# Patient Record
Sex: Female | Born: 1960 | Race: White | Hispanic: No | Marital: Married | Smoking: Former smoker
Health system: Southern US, Community
[De-identification: ages and names within clinical notes are randomized; demographics above are authoritative.]

## PROBLEM LIST (undated history)

## (undated) DIAGNOSIS — K589 Irritable bowel syndrome without diarrhea: Secondary | ICD-10-CM

## (undated) DIAGNOSIS — B0229 Other postherpetic nervous system involvement: Secondary | ICD-10-CM

## (undated) HISTORY — PX: EYE MUSCLE SURGERY: SHX370

## (undated) HISTORY — PX: TONSILLECTOMY: SUR1361

## (undated) HISTORY — DX: Other postherpetic nervous system involvement: B02.29

## (undated) HISTORY — DX: Irritable bowel syndrome, unspecified: K58.9

---

## 2001-05-26 ENCOUNTER — Other Ambulatory Visit: Admission: RE | Admit: 2001-05-26 | Discharge: 2001-05-26 | Payer: Self-pay | Admitting: Gynecology

## 2001-06-02 ENCOUNTER — Encounter (INDEPENDENT_AMBULATORY_CARE_PROVIDER_SITE_OTHER): Payer: Self-pay | Admitting: Specialist

## 2001-06-02 ENCOUNTER — Other Ambulatory Visit: Admission: RE | Admit: 2001-06-02 | Discharge: 2001-06-02 | Payer: Self-pay | Admitting: Gynecology

## 2002-07-30 ENCOUNTER — Encounter: Payer: Self-pay | Admitting: General Practice

## 2002-07-30 ENCOUNTER — Encounter: Admission: RE | Admit: 2002-07-30 | Discharge: 2002-07-30 | Payer: Self-pay | Admitting: General Practice

## 2003-06-14 ENCOUNTER — Encounter: Payer: Self-pay | Admitting: Family Medicine

## 2003-06-14 ENCOUNTER — Encounter: Admission: RE | Admit: 2003-06-14 | Discharge: 2003-06-14 | Payer: Self-pay | Admitting: Family Medicine

## 2003-08-25 ENCOUNTER — Encounter: Admission: RE | Admit: 2003-08-25 | Discharge: 2003-08-25 | Payer: Self-pay | Admitting: Orthopedic Surgery

## 2003-08-25 ENCOUNTER — Encounter: Payer: Self-pay | Admitting: Orthopedic Surgery

## 2003-09-23 ENCOUNTER — Encounter: Admission: RE | Admit: 2003-09-23 | Discharge: 2003-09-23 | Payer: Self-pay | Admitting: Orthopedic Surgery

## 2003-10-13 ENCOUNTER — Encounter: Admission: RE | Admit: 2003-10-13 | Discharge: 2003-10-13 | Payer: Self-pay | Admitting: Orthopedic Surgery

## 2004-11-22 ENCOUNTER — Encounter: Admission: RE | Admit: 2004-11-22 | Discharge: 2004-11-22 | Payer: Self-pay | Admitting: Family Medicine

## 2005-02-15 ENCOUNTER — Encounter: Admission: RE | Admit: 2005-02-15 | Discharge: 2005-02-15 | Payer: Self-pay | Admitting: Obstetrics and Gynecology

## 2006-02-20 ENCOUNTER — Other Ambulatory Visit: Admission: RE | Admit: 2006-02-20 | Discharge: 2006-02-20 | Payer: Self-pay | Admitting: Obstetrics & Gynecology

## 2006-05-16 ENCOUNTER — Encounter: Admission: RE | Admit: 2006-05-16 | Discharge: 2006-05-16 | Payer: Self-pay | Admitting: Family Medicine

## 2006-05-21 ENCOUNTER — Encounter: Admission: RE | Admit: 2006-05-21 | Discharge: 2006-05-21 | Payer: Self-pay | Admitting: Otolaryngology

## 2006-05-23 ENCOUNTER — Encounter: Admission: RE | Admit: 2006-05-23 | Discharge: 2006-05-23 | Payer: Self-pay | Admitting: Otolaryngology

## 2006-08-22 ENCOUNTER — Encounter: Admission: RE | Admit: 2006-08-22 | Discharge: 2006-08-22 | Payer: Self-pay | Admitting: Obstetrics & Gynecology

## 2007-09-15 ENCOUNTER — Encounter: Admission: RE | Admit: 2007-09-15 | Discharge: 2007-09-15 | Payer: Self-pay | Admitting: Obstetrics and Gynecology

## 2007-09-18 ENCOUNTER — Encounter: Admission: RE | Admit: 2007-09-18 | Discharge: 2007-09-18 | Payer: Self-pay | Admitting: Obstetrics and Gynecology

## 2008-02-22 ENCOUNTER — Encounter: Admission: RE | Admit: 2008-02-22 | Discharge: 2008-02-22 | Payer: Self-pay | Admitting: General Practice

## 2008-04-08 ENCOUNTER — Encounter (INDEPENDENT_AMBULATORY_CARE_PROVIDER_SITE_OTHER): Payer: Self-pay | Admitting: General Surgery

## 2008-04-08 ENCOUNTER — Ambulatory Visit (HOSPITAL_BASED_OUTPATIENT_CLINIC_OR_DEPARTMENT_OTHER): Admission: RE | Admit: 2008-04-08 | Discharge: 2008-04-08 | Payer: Self-pay | Admitting: General Surgery

## 2008-08-09 ENCOUNTER — Other Ambulatory Visit: Admission: RE | Admit: 2008-08-09 | Discharge: 2008-08-09 | Payer: Self-pay | Admitting: Obstetrics and Gynecology

## 2010-12-16 ENCOUNTER — Encounter: Payer: Self-pay | Admitting: Obstetrics and Gynecology

## 2011-04-09 NOTE — Op Note (Signed)
NAMELACREASHA, HINDS                ACCOUNT NO.:  [id number]   MEDICAL RECORD NO.:  [id number]          PATIENT TYPE:  AMB   LOCATION:  DSC                          FACILITY:  [facility]   PHYSICIAN:  Gabrielle Dare. Janee Morn, M.D.DATE OF BIRTH:  1961-05-15   DATE OF PROCEDURE:  04/08/2008  DATE OF DISCHARGE:                               OPERATIVE REPORT   PREOPERATIVE DIAGNOSIS:  Mass, right posterior neck.   POSTOPERATIVE DIAGNOSIS:  Mass, right posterior neck.   PROCEDURE:  Excision of mass, right posterior neck.   SURGEON:  Gabrielle Dare. Janee Morn, MD.   ANESTHESIA:  MAC   HISTORY OF PRESENT ILLNESS:  Ms. Crystal [name] is a 50 year old female who I  evaluated in the office for symptomatic tender mass in her right  posterior neck.  CT scan of the neck had demonstrated no suspicious  abnormality.  She has a palpable 2-cm mass, which has been tender.  She  presents for elective excision.   PROCEDURE IN DETAIL:  Informed consent was obtained.  The patient was  identified in the preop holding area.  Her site was marked.  She  received intravenous antibiotics.  She was brought to the operating  room.  MAC anesthesia was administered by the Anesthesia staff.  She was  placed in a prone position.  Posterior neck on the right was prepped and  draped in sterile fashion.  This lesion was palpable on the subcutaneous  tissue about 2-cm in size.  The area over the top of the palpable lesion  was anesthetized with 0.25% Marcaine with epinephrine mixed with 1%  lidocaine plain.  A transverse incision was made.  Subcutaneous tissues  were dissected down and this mass was circumferentially dissected.  It  extended down and was adherent to the fascia.  It was completely excised  and sent to Pathology.  Wound was copiously irrigated.  Some additional  local anesthetic was injected.  Meticulous hemostasis was ensured.  Sponge, needle, and instrument counts were correct.  The wound was then  closed in layers with deep  tissues approximated with interrupted 3-0  Vicryl sutures and the skin closed with a running 4-0 Monocryl  subcuticular stitch.  Dermabond was then placed as final closure.  Sponge, needle, and instrument counts were again correct.  The patient  tolerated the procedure well without apparent complication.  She was  taken to the recovery room in stable condition.      Gabrielle Dare Janee Morn, M.D.  Electronically Signed     BET/MEDQ  D:  04/08/2008  T:  04/08/2008  Job:  [id number]   cc:   Louanna Raw

## 2011-08-21 LAB — POCT HEMOGLOBIN-HEMACUE: Hemoglobin: 13.8

## 2012-03-20 LAB — HM COLONOSCOPY

## 2013-09-27 ENCOUNTER — Ambulatory Visit (INDEPENDENT_AMBULATORY_CARE_PROVIDER_SITE_OTHER): Payer: Self-pay | Admitting: General Practice

## 2013-09-27 DIAGNOSIS — W19XXXA Unspecified fall, initial encounter: Secondary | ICD-10-CM

## 2013-09-27 DIAGNOSIS — S0990XA Unspecified injury of head, initial encounter: Secondary | ICD-10-CM

## 2013-09-27 NOTE — Progress Notes (Signed)
  Subjective:    Patient ID: Crystal [name], female    DOB: [birth year], 52 y.o.   MRN: [id number]  HPI Patient presents to office with complaints of forehead laceration, due to fall. Reports fall occurred 20 minutes ago, while standing on bed attempting to clean headboard. Crystal [name] report loosing her balance and then hit head on ceiling fan blade or glass globe cover. Reports applying pressure to bleeding area immediately. She denies loss of consciousness.     Review of Systems  Constitutional: Negative for fever and chills.  Eyes: Negative for pain, redness and visual disturbance.  Respiratory: Negative for chest tightness and shortness of breath.   Cardiovascular: Negative for chest pain and palpitations.  Neurological: Negative for dizziness, syncope, speech difficulty, weakness and headaches.       Objective:   Physical Exam  Constitutional: She is oriented to person, place, and time. She appears well-developed and well-nourished.  Cardiovascular: Normal rate, regular rhythm and normal heart sounds.   Pulmonary/Chest: Effort normal and breath sounds normal. No respiratory distress. She exhibits no tenderness.  Musculoskeletal: She exhibits edema and tenderness.  Tenderness noted to left forehead, surrounding laceration. 1 inch laceration, small amount bloody drainage.   Neurological: She is alert and oriented to person, place, and time. No cranial nerve deficit. Coordination normal.  Skin: Skin is warm and dry.  Psychiatric: She has a normal mood and affect.          Assessment & Plan:  1. Head injury, initial encounter -cleansed left forehead area with normal saline and dried with sterile gauze -dermabond (skin glue) applied by MMM -discussed proper care of head injury and signs/symptoms that require medical attention with patient   2. Fall, initial encounter - CT Head Wo Contrast; Future -discussed importance of having CT scan, patient verbalized understanding and has  responsible driver to transport her to the hospital -RTO on 09/30/13 for follow up of forehead laceration -Patient verbalized understanding  Coralie Keens, FNP-C

## 2013-09-27 NOTE — Patient Instructions (Signed)
Head Injury, Adult °You have had a head injury that does not appear serious at this time. A concussion is a state of changed mental ability, usually from a blow to the head. You should take clear liquids for the rest of the day and then resume your regular diet. You should not take sedatives or alcoholic beverages for as long as directed by your caregiver after discharge. After injuries such as yours, most problems occur within the first 24 hours. °SYMPTOMS °These minor symptoms may be experienced after discharge: °· Memory difficulties. °· Dizziness. °· Headaches. °· Double vision. °· Hearing difficulties. °· Depression. °· Tiredness. °· Weakness. °· Difficulty with concentration. °If you experience any of these problems, you should not be alarmed. A concussion requires a few days for recovery. Many patients with head injuries frequently experience such symptoms. Usually, these problems disappear without medical care. If symptoms last for more than one day, notify your caregiver. See your caregiver sooner if symptoms are becoming worse rather than better. °HOME CARE INSTRUCTIONS  °· During the next 24 hours you must stay with someone who can watch you for the warning signs listed below. °Although it is unlikely that serious side effects will occur, you should be aware of signs and symptoms which may necessitate your return to this location. Side effects may occur up to 7  10 days following the injury. It is important for you to carefully monitor your condition and contact your caregiver or seek immediate medical attention if there is a change in your condition. °SEEK IMMEDIATE MEDICAL CARE IF:  °· There is confusion or drowsiness. °· You can not awaken the injured person. °· There is nausea (feeling sick to your stomach) or continued, forceful vomiting. °· You notice dizziness or unsteadiness which is getting worse, or inability to walk. °· You have convulsions or unconsciousness. °· You experience severe,  persistent headaches not relieved by over-the-counter or prescription medicines for pain. (Do not take aspirin as this impairs clotting abilities). Take other pain medications only as directed. °· You can not use arms or legs normally. °· There is clear or bloody discharge from the nose or ears. °MAKE SURE YOU:  °· Understand these instructions. °· Will watch your condition. °· Will get help right away if you are not doing well or get worse. °Document Released: 11/11/2005 Document Revised: 02/03/2012 Document Reviewed: 09/29/2009 °ExitCare® Patient Information ©2014 [employer]. ° °

## 2014-10-31 ENCOUNTER — Telehealth: Payer: Self-pay | Admitting: General Practice

## 2014-11-01 ENCOUNTER — Ambulatory Visit (INDEPENDENT_AMBULATORY_CARE_PROVIDER_SITE_OTHER): Payer: PRIVATE HEALTH INSURANCE | Admitting: Family Medicine

## 2014-11-01 ENCOUNTER — Encounter (INDEPENDENT_AMBULATORY_CARE_PROVIDER_SITE_OTHER): Payer: Self-pay

## 2014-11-01 ENCOUNTER — Encounter: Payer: Self-pay | Admitting: Family Medicine

## 2014-11-01 VITALS — BP 109/62 | HR 64 | Temp 97.8°F | Ht 64.0 in | Wt 133.2 lb

## 2014-11-01 DIAGNOSIS — L0501 Pilonidal cyst with abscess: Secondary | ICD-10-CM

## 2014-11-01 MED ORDER — PENICILLIN V POTASSIUM 500 MG PO TABS: 500.0000 mg | ORAL_TABLET | Freq: Four times a day (QID) | ORAL | Status: DC

## 2014-11-01 MED ORDER — HYDROCODONE-ACETAMINOPHEN 5-325 MG PO TABS: 1.0000 | ORAL_TABLET | Freq: Four times a day (QID) | ORAL | Status: DC | PRN

## 2014-11-01 MED ORDER — CEFTRIAXONE SODIUM 1 G IJ SOLR
1.0000 g | Freq: Once | INTRAMUSCULAR | Status: AC
  Administered 2014-11-01: 1 g via INTRAMUSCULAR

## 2014-11-01 NOTE — Progress Notes (Signed)
   Subjective:    Patient ID: Crystal [name], female    DOB: 1961-06-03, 53 y.o.   MRN: [id number]  HPI Patient c/o cyst in her coccyx area.  Review of Systems  Constitutional: Negative for fever.  HENT: Negative for ear pain.   Eyes: Negative for discharge.  Respiratory: Negative for cough.   Cardiovascular: Negative for chest pain.  Gastrointestinal: Negative for abdominal distention.  Endocrine: Negative for polyuria.  Genitourinary: Negative for difficulty urinating.  Musculoskeletal: Negative for gait problem and neck pain.  Skin: Negative for color change and rash.  Neurological: Negative for speech difficulty and headaches.  Psychiatric/Behavioral: Negative for agitation.       Objective:    BP 109/62 mmHg  Pulse 64  Temp(Src) 97.8 F (36.6 C) (Oral)  Ht 5\' 4"  (1.626 m)  Wt 133 lb 3.2 oz (60.419 kg)  BMI 22.85 kg/m2 Physical Exam  Constitutional: She is oriented to person, place, and time. She appears well-developed and well-nourished.  HENT:  Head: Normocephalic and atraumatic.  Mouth/Throat: Oropharynx is clear and moist.  Eyes: Pupils are equal, round, and reactive to light.  Neck: Normal range of motion. Neck supple.  Cardiovascular: Normal rate and regular rhythm.   No murmur heard. Pulmonary/Chest: Effort normal and breath sounds normal.  Abdominal: Soft. Bowel sounds are normal. There is no tenderness.  Neurological: She is alert and oriented to person, place, and time.  Skin: Skin is warm and dry.  Coccyx region with tenderness and fluctuance. x Psychiatric: She has a normal mood and affect.     Procedure - Cyst area cleansed and prepped with betadine and then area anesthetized with 3 cc's lidocaine with epi and then once adequate anesthesia is acquired a stab incision is made in the gluteal cleft proximal to coccyx and cyst mass area and approx 2 cc's of purulence and serous sanguin drained and then dressing applied.     Assessment & Plan:    ICD-9-CM ICD-10-CM   1. Pilonidal abscess 685.0 L05.01 cefTRIAXone (ROCEPHIN) injection 1 g     HYDROcodone-acetaminophen (NORCO) 5-325 MG per tablet     penicillin v potassium (VEETID) 500 MG tablet     Ambulatory referral to General Surgery   Referral to Surgery made, 1 gram of rocephin IM given, Pen VK 500mg  one po qid x 10 days  No Follow-up on file.  Deatra CanterWilliam J Oxford FNP

## 2014-11-01 NOTE — Telephone Encounter (Signed)
Appointment given for 6:30 this evening to address the cyst and then patient is to schedule to evaluate her shoulder.

## 2014-11-03 ENCOUNTER — Ambulatory Visit (INDEPENDENT_AMBULATORY_CARE_PROVIDER_SITE_OTHER): Payer: Self-pay | Admitting: Surgery

## 2014-11-03 NOTE — H&P (Signed)
Crystal [name] 11/03/2014 4:50 PM Location: [facility] Patient #: [id number] DOB: March 17, 1961 Married / Language: Undefined / Race: Undefined Female History of Present Illness (Ly Wass A. Werner Labella MD; 11/03/2014 4:52 PM) Patient words: Patient presents with a pilonidal cyst. This [facility]. She still has pain and has chronic pain over her tailbone. As a teenager, she had a pilonidal cyst excised. This is the same spot. He gets red and tender. This was incised 2 days ago and she was placed on penicillin. She is here today to discuss definitive treatment. She still some discomfort over that region. No drainage or fever.  The patient is a 53 year old female    Physical Exam (Nero Sawatzky A. Revel Stellmach MD; 11/03/2014 4:54 PM)  General Mental Status-Alert. General Appearance-Consistent with stated age. Hydration-Well hydrated. Voice-Normal.  Head and Neck Head-normocephalic, atraumatic with no lesions or palpable masses. Trachea-midline. Thyroid Gland Characteristics - normal size and consistency.  Eye Eyeball - Bilateral-Extraocular movements intact. Sclera/Conjunctiva - Bilateral-No scleral icterus.  Chest and Lung Exam Chest and lung exam reveals -quiet, even and easy respiratory effort with no use of accessory muscles and on auscultation, normal breath sounds, no adventitious sounds and normal vocal resonance. Inspection Chest Wall - Normal. Back - normal.  Breast Breast - Left-Symmetric, Non Tender, No Biopsy scars, no Dimpling, No Inflammation, No Lumpectomy scars, No Mastectomy scars, No Peau d' Orange. Breast - Right-Symmetric, Non Tender, No Biopsy scars, no Dimpling, No Inflammation, No Lumpectomy scars, No Mastectomy scars, No Peau d' Orange. Breast Lump-No Palpable Breast Mass.  Cardiovascular Cardiovascular examination reveals -normal heart sounds, regular rate and rhythm with no murmurs and  normal pedal pulses bilaterally.  Abdomen Inspection Inspection of the abdomen reveals - No Hernias. Skin - Scar - no surgical scars. Palpation/Percussion Palpation and Percussion of the abdomen reveal - Soft, Non Tender, No Rebound tenderness, No Rigidity (guarding) and No hepatosplenomegaly. Auscultation Auscultation of the abdomen reveals - Bowel sounds normal.  Rectal Note: 1 cm area of minimal inflammation over coccyx consistent with small pilonidal cyst. no draianable abscess. small incision noted.    Neurologic Neurologic evaluation reveals -alert and oriented x 3 with no impairment of recent or remote memory. Mental Status-Normal.  Musculoskeletal Normal Exam - Left-Upper Extremity Strength Normal and Lower Extremity Strength Normal. Normal Exam - Right-Upper Extremity Strength Normal and Lower Extremity Strength Normal.  Lymphatic Head & Neck  General Head & Neck Lymphatics: Bilateral - Description - Normal. Axillary  General Axillary Region: Bilateral - Description - Normal. Tenderness - Non Tender. Femoral & Inguinal  Generalized Femoral & Inguinal Lymphatics: Bilateral - Description - Normal. Tenderness - Non Tender.    Assessment & Plan (Isa Kohlenberg A. Fernando Stoiber MD; 11/03/2014 4:55 PM)  INFECTED PILONIDAL CYST (685.1  L05.91) Impression: it was drained 2 days ago. minimal erythema and no fluctuance. Will need excision once infection cleared. Stop penicillin and start doxycycline 100 mg po bid for two weeks. Risk of bleeding infection poor healing and the need for other operations. Non operative care is antibiotics and observation. She agrees to procedeed.

## 2014-12-09 ENCOUNTER — Ambulatory Visit (INDEPENDENT_AMBULATORY_CARE_PROVIDER_SITE_OTHER): Payer: PRIVATE HEALTH INSURANCE | Admitting: Family Medicine

## 2014-12-09 ENCOUNTER — Ambulatory Visit (INDEPENDENT_AMBULATORY_CARE_PROVIDER_SITE_OTHER): Payer: PRIVATE HEALTH INSURANCE

## 2014-12-09 VITALS — BP 124/77 | HR 59 | Temp 97.1°F | Ht 64.0 in | Wt 132.4 lb

## 2014-12-09 DIAGNOSIS — J069 Acute upper respiratory infection, unspecified: Secondary | ICD-10-CM

## 2014-12-09 DIAGNOSIS — M542 Cervicalgia: Secondary | ICD-10-CM

## 2014-12-09 DIAGNOSIS — B0229 Other postherpetic nervous system involvement: Secondary | ICD-10-CM

## 2014-12-09 MED ORDER — AZITHROMYCIN 250 MG PO TABS: ORAL_TABLET | ORAL | Status: DC

## 2014-12-09 MED ORDER — GABAPENTIN 300 MG PO CAPS: 300.0000 mg | ORAL_CAPSULE | Freq: Three times a day (TID) | ORAL | Status: DC

## 2014-12-09 MED ORDER — METHYLPREDNISOLONE (PAK) 4 MG PO TABS: ORAL_TABLET | ORAL | Status: DC

## 2014-12-09 NOTE — Progress Notes (Signed)
   Subjective:    Patient ID: Crystal [name], female    DOB: 05-14-1961, 54 y.o.   MRN: [id number]  HPI Patient is here c/o left neck discomfort and numbness.  She has had outbreak of shingles a month ago.  She has been having uri sx's and right ear pain.  Review of Systems  Constitutional: Negative for fever.  HENT: Negative for ear pain.   Eyes: Negative for discharge.  Respiratory: Negative for cough.   Cardiovascular: Negative for chest pain.  Gastrointestinal: Negative for abdominal distention.  Endocrine: Negative for polyuria.  Genitourinary: Negative for difficulty urinating.  Musculoskeletal: Negative for gait problem and neck pain.  Skin: Negative for color change and rash.  Neurological: Negative for speech difficulty and headaches.  Psychiatric/Behavioral: Negative for agitation.       Objective:    BP 124/77 mmHg  Pulse 59  Temp(Src) 97.1 F (36.2 C) (Oral)  Ht 5\' 4"  (1.626 m)  Wt 132 lb 6.4 oz (60.056 kg)  BMI 22.72 kg/m2 Physical Exam  Constitutional: She is oriented to person, place, and time. She appears well-developed and well-nourished.  HENT:  Head: Normocephalic and atraumatic.  Mouth/Throat: Oropharynx is clear and moist.  Eyes: Pupils are equal, round, and reactive to light.  Neck: Normal range of motion. Neck supple.  Cardiovascular: Normal rate and regular rhythm.   No murmur heard. Pulmonary/Chest: Effort normal and breath sounds normal.  Abdominal: Soft. Bowel sounds are normal. There is no tenderness.  Neurological: She is alert and oriented to person, place, and time.  Skin: Skin is warm and dry.  Psychiatric: She has a normal mood and affect.          Assessment & Plan:     ICD-9-CM ICD-10-CM   1. URI, acute 465.9 J06.9 methylPREDNIsolone (MEDROL DOSPACK) 4 MG tablet     azithromycin (ZITHROMAX) 250 MG tablet  2. Post herpetic neuralgia 053.19 B02.29 gabapentin (NEURONTIN) 300 MG capsule     No Follow-up on file.  Deatra CanterWilliam J  Oxford FNP

## 2014-12-23 ENCOUNTER — Telehealth: Payer: Self-pay | Admitting: Family Medicine

## 2014-12-23 ENCOUNTER — Other Ambulatory Visit: Payer: Self-pay | Admitting: Family Medicine

## 2014-12-23 MED ORDER — GABAPENTIN 600 MG PO TABS: 600.0000 mg | ORAL_TABLET | Freq: Three times a day (TID) | ORAL | Status: DC

## 2014-12-23 NOTE — Telephone Encounter (Signed)
Pt aware.

## 2014-12-23 NOTE — Telephone Encounter (Signed)
Can increase gabapentin to two po tid and rx for 600mg  po tid sent to pharmacy

## 2015-01-18 ENCOUNTER — Encounter (HOSPITAL_BASED_OUTPATIENT_CLINIC_OR_DEPARTMENT_OTHER): Payer: Self-pay

## 2015-01-18 ENCOUNTER — Ambulatory Visit (HOSPITAL_BASED_OUTPATIENT_CLINIC_OR_DEPARTMENT_OTHER): Admit: 2015-01-18 | Payer: Self-pay | Admitting: Surgery

## 2015-01-18 SURGERY — EXCISION, PILONIDAL CYST, EXTENSIVE
Anesthesia: General

## 2015-01-27 ENCOUNTER — Ambulatory Visit (INDEPENDENT_AMBULATORY_CARE_PROVIDER_SITE_OTHER): Payer: PRIVATE HEALTH INSURANCE | Admitting: Family

## 2015-01-27 ENCOUNTER — Encounter: Payer: Self-pay | Admitting: Family

## 2015-01-27 VITALS — BP 130/77 | HR 64 | Temp 97.6°F | Ht 64.0 in | Wt 130.0 lb

## 2015-01-27 DIAGNOSIS — R059 Cough, unspecified: Secondary | ICD-10-CM

## 2015-01-27 DIAGNOSIS — R05 Cough: Secondary | ICD-10-CM | POA: Diagnosis not present

## 2015-01-27 DIAGNOSIS — J0110 Acute frontal sinusitis, unspecified: Secondary | ICD-10-CM

## 2015-01-27 MED ORDER — AMOXICILLIN-POT CLAVULANATE 875-125 MG PO TABS: 1.0000 | ORAL_TABLET | Freq: Two times a day (BID) | ORAL | Status: DC

## 2015-01-27 MED ORDER — BENZONATATE 200 MG PO CAPS: 200.0000 mg | ORAL_CAPSULE | Freq: Three times a day (TID) | ORAL | Status: DC | PRN

## 2015-01-27 NOTE — Patient Instructions (Signed)
Sinusitis Sinusitis is redness, soreness, and inflammation of the paranasal sinuses. Paranasal sinuses are air pockets within the bones of your face (beneath the eyes, the middle of the forehead, or above the eyes). In healthy paranasal sinuses, mucus is able to drain out, and air is able to circulate through them by way of your nose. However, when your paranasal sinuses are inflamed, mucus and air can become trapped. This can allow bacteria and other germs to grow and cause infection. Sinusitis can develop quickly and last only a short time (acute) or continue over a long period (chronic). Sinusitis that lasts for more than 12 weeks is considered chronic.  CAUSES  Causes of sinusitis include:  Allergies.  Structural abnormalities, such as displacement of the cartilage that separates your nostrils (deviated septum), which can decrease the air flow through your nose and sinuses and affect sinus drainage.  Functional abnormalities, such as when the small hairs (cilia) that line your sinuses and help remove mucus do not work properly or are not present. SIGNS AND SYMPTOMS  Symptoms of acute and chronic sinusitis are the same. The primary symptoms are pain and pressure around the affected sinuses. Other symptoms include:  Upper toothache.  Earache.  Headache.  Bad breath.  Decreased sense of smell and taste.  A cough, which worsens when you are lying flat.  Fatigue.  Fever.  Thick drainage from your nose, which often is green and may contain pus (purulent).  Swelling and warmth over the affected sinuses. DIAGNOSIS  Your health care provider will perform a physical exam. During the exam, your health care provider may:  Look in your nose for signs of abnormal growths in your nostrils (nasal polyps).  Tap over the affected sinus to check for signs of infection.  View the inside of your sinuses (endoscopy) using an imaging device that has a light attached (endoscope). If your health  care provider suspects that you have chronic sinusitis, one or more of the following tests may be recommended:  Allergy tests.  Nasal culture. A sample of mucus is taken from your nose, sent to a lab, and screened for bacteria.  Nasal cytology. A sample of mucus is taken from your nose and examined by your health care provider to determine if your sinusitis is related to an allergy. TREATMENT  Most cases of acute sinusitis are related to a viral infection and will resolve on their own within 10 days. Sometimes medicines are prescribed to help relieve symptoms (pain medicine, decongestants, nasal steroid sprays, or saline sprays).  However, for sinusitis related to a bacterial infection, your health care provider will prescribe antibiotic medicines. These are medicines that will help kill the bacteria causing the infection.  Rarely, sinusitis is caused by a fungal infection. In theses cases, your health care provider will prescribe antifungal medicine. For some cases of chronic sinusitis, surgery is needed. Generally, these are cases in which sinusitis recurs more than 3 times per year, despite other treatments. HOME CARE INSTRUCTIONS   Drink plenty of water. Water helps thin the mucus so your sinuses can drain more easily.  Use a humidifier.  Inhale steam 3 to 4 times a day (for example, sit in the bathroom with the shower running).  Apply a warm, moist washcloth to your face 3 to 4 times a day, or as directed by your health care provider.  Use saline nasal sprays to help moisten and clean your sinuses.  Take medicines only as directed by your health care provider.    If you were prescribed either an antibiotic or antifungal medicine, finish it all even if you start to feel better. SEEK IMMEDIATE MEDICAL CARE IF:  You have increasing pain or severe headaches.  You have nausea, vomiting, or drowsiness.  You have swelling around your face.  You have vision problems.  You have a stiff  neck.  You have difficulty breathing. MAKE SURE YOU:   Understand these instructions.  Will watch your condition.  Will get help right away if you are not doing well or get worse. Document Released: 11/11/2005 Document Revised: 03/28/2014 Document Reviewed: 11/26/2011 ExitCare Patient Information 2015 [employer]. This information is not intended to replace advice given to you by your health care provider. Make sure you discuss any questions you have with your health care provider.  - Take meds as prescribed - Use a cool mist humidifier  -Use saline nose sprays frequently -Saline irrigations of the nose can be very helpful if done frequently.  * 4X daily for 1 week*  * Use of a nettie pot can be helpful with this. Follow directions with this* -Force fluids -For any cough or congestion  Use plain Mucinex- regular strength or max strength is fine   * Children- consult with Pharmacist for dosing -For fever or aces or pains- take tylenol or ibuprofen appropriate for age and weight.  * for fevers greater than 101 orally you may alternate ibuprofen and tylenol every  3 hours. -Throat lozenges if help   [name], FNP  

## 2015-01-27 NOTE — Progress Notes (Signed)
Subjective:    Patient ID: Crystal [name], female    DOB: [birth year], 54 y.o.   MRN: [id number]  Sinusitis The current episode started in the past 7 days. The problem has been waxing and waning since onset. There has been no fever. The fever has been present for 1 to 2 days. Her pain is at a severity of 5/10. The pain is mild. Associated symptoms include chills, congestion, coughing, ear pain, headaches, a hoarse voice and sinus pressure. Pertinent negatives include no shortness of breath, sneezing or sore throat. Past treatments include acetaminophen. The treatment provided mild relief.  Otalgia  Associated symptoms include coughing and headaches. Pertinent negatives include no sore throat.      Review of Systems  Constitutional: Positive for chills.  HENT: Positive for congestion, ear pain, hoarse voice and sinus pressure. Negative for sneezing and sore throat.   Eyes: Negative.   Respiratory: Positive for cough. Negative for shortness of breath.   Cardiovascular: Negative.  Negative for palpitations.  Gastrointestinal: Negative.   Endocrine: Negative.   Genitourinary: Negative.   Musculoskeletal: Negative.   Neurological: Positive for headaches.  Hematological: Negative.   Psychiatric/Behavioral: Negative.   All other systems reviewed and are negative.      Objective:   Physical Exam  Constitutional: She is oriented to person, place, and time. She appears well-developed and well-nourished. No distress.  HENT:  Head: Normocephalic and atraumatic.  Right Ear: External ear normal.  Left Ear: External ear normal.  Nose: Right sinus exhibits maxillary sinus tenderness and frontal sinus tenderness. Left sinus exhibits maxillary sinus tenderness and frontal sinus tenderness.  Mouth/Throat: Oropharynx is clear and moist.  Nasal passage erythemas with mild swelling  Oropharynx erythemas   Eyes: Pupils are equal, round, and reactive to light.  Neck: Normal range of motion. Neck  supple. No thyromegaly present.  Cardiovascular: Normal rate, regular rhythm, normal heart sounds and intact distal pulses.   No murmur heard. Pulmonary/Chest: Effort normal and breath sounds normal. No respiratory distress. She has no wheezes.  Abdominal: Soft. Bowel sounds are normal. She exhibits no distension. There is no tenderness.  Musculoskeletal: Normal range of motion. She exhibits no edema or tenderness.  Neurological: She is alert and oriented to person, place, and time. She has normal reflexes. No cranial nerve deficit.  Skin: Skin is warm and dry.  Psychiatric: She has a normal mood and affect. Her behavior is normal. Judgment and thought content normal.  Vitals reviewed.     BP 130/77 mmHg  Pulse 64  Temp(Src) 97.6 F (36.4 C) (Oral)  Ht 5\' 4"  (1.626 m)  Wt 130 lb (58.968 kg)  BMI 22.30 kg/m2     Assessment & Plan:  1. Acute frontal sinusitis, recurrence not specified - Take meds as prescribed - Use a cool mist humidifier  -Use saline nose sprays frequently -Saline irrigations of the nose can be very helpful if done frequently.  * 4X daily for 1 week*  * Use of a nettie pot can be helpful with this. Follow directions with this* -Force fluids -For any cough or congestion  Use plain Mucinex- regular strength or max strength is fine   * Children- consult with Pharmacist for dosing -For fever or aces or pains- take tylenol or ibuprofen appropriate for age and weight.  * for fevers greater than 101 orally you may alternate ibuprofen and tylenol every  3 hours. -Throat lozenges if help - amoxicillin-clavulanate (AUGMENTIN) 875-125 MG per tablet; Take 1 tablet by  mouth 2 (two) times daily.  Dispense: 14 tablet; Refill: 0  2. Cough - amoxicillin-clavulanate (AUGMENTIN) 875-125 MG per tablet; Take 1 tablet by mouth 2 (two) times daily.  Dispense: 14 tablet; Refill: 0 - benzonatate (TESSALON) 200 MG capsule; Take 1 capsule (200 mg total) by mouth 3 (three) times daily  as needed.  Dispense: 30 capsule; Refill: 1  Jannifer Rodney, FNP

## 2015-02-03 ENCOUNTER — Telehealth: Payer: Self-pay | Admitting: Family

## 2015-02-03 NOTE — Telephone Encounter (Signed)
Appointment scheduled for 3/12 with Crystal [name]

## 2015-02-04 ENCOUNTER — Ambulatory Visit (INDEPENDENT_AMBULATORY_CARE_PROVIDER_SITE_OTHER): Payer: PRIVATE HEALTH INSURANCE | Admitting: Nurse Practitioner

## 2015-02-04 ENCOUNTER — Encounter: Payer: Self-pay | Admitting: Nurse Practitioner

## 2015-02-04 VITALS — BP 112/64 | HR 61 | Temp 97.3°F | Ht 64.0 in | Wt 133.8 lb

## 2015-02-04 DIAGNOSIS — J069 Acute upper respiratory infection, unspecified: Secondary | ICD-10-CM

## 2015-02-04 NOTE — Patient Instructions (Signed)

## 2015-02-04 NOTE — Progress Notes (Signed)
   Subjective:    Patient ID: Crystal [name], female    DOB: [birth year], 54 y.o.   MRN: [id number]  HPI Patient in with several complaints; -Rash popped up on right upper arm yesterday- itchy and dry- but seems better this morning - was seen 1 week ago with sinus infection- has been on antibiotic and tessalon perles- feels real tired and run down.    Review of Systems  Constitutional: Positive for fatigue. Negative for fever and chills.  HENT: Positive for congestion and rhinorrhea.   Respiratory: Positive for cough.   Cardiovascular: Negative.   Gastrointestinal: Negative.   Genitourinary: Negative.   Neurological: Negative.   Psychiatric/Behavioral: Negative.   All other systems reviewed and are negative.      Objective:   Physical Exam  Constitutional: She is oriented to person, place, and time. She appears well-developed and well-nourished.  HENT:  Right Ear: External ear normal.  Left Ear: External ear normal.  Nose: Nose normal.  Mouth/Throat: Oropharynx is clear and moist.  Eyes: Conjunctivae are normal. Pupils are equal, round, and reactive to light.  Neck: Normal range of motion. Neck supple.  Cardiovascular: Normal rate, regular rhythm and normal heart sounds.   Pulmonary/Chest: Effort normal and breath sounds normal.  Abdominal: Soft. Bowel sounds are normal.  Neurological: She is alert and oriented to person, place, and time.  Skin: Skin is warm and dry.  No rash visible on right upper arm today  Psychiatric: She has a normal mood and affect. Her behavior is normal. Judgment and thought content normal.   BP 112/64 mmHg  Pulse 61  Temp(Src) 97.3 F (36.3 C) (Oral)  Ht 5\' 4"  (1.626 m)  Wt 133 lb 12.8 oz (60.691 kg)  BMI 22.96 kg/m2        Assessment & Plan:   1. Upper respiratory infection with cough and congestion    1. Take meds as prescribed 2. Use a cool mist humidifier especially during the winter months and when heat has been humid. 3. Use  saline nose sprays frequently 4. Saline irrigations of the nose can be very helpful if done frequently.  * 4X daily for 1 week*  * Use of a nettie pot can be helpful with this. Follow directions with this* 5. Drink plenty of fluids 6. Keep thermostat turn down low 7.For any cough or congestion  Use plain Mucinex- regular strength or max strength is fine   * Children- consult with Pharmacist for dosing 8. For fever or aces or pains- take tylenol or ibuprofen appropriate for age and weight.  * for fevers greater than 101 orally you may alternate ibuprofen and tylenol every  3 hours.   Mary-Margaret Daphine DeutscherMartin, FNP

## 2015-04-17 ENCOUNTER — Ambulatory Visit (INDEPENDENT_AMBULATORY_CARE_PROVIDER_SITE_OTHER): Payer: PRIVATE HEALTH INSURANCE | Admitting: Family

## 2015-04-17 ENCOUNTER — Encounter: Payer: Self-pay | Admitting: Family

## 2015-04-17 VITALS — BP 125/76 | HR 59 | Temp 97.5°F | Ht 64.0 in | Wt 131.0 lb

## 2015-04-17 DIAGNOSIS — H6591 Unspecified nonsuppurative otitis media, right ear: Secondary | ICD-10-CM

## 2015-04-17 DIAGNOSIS — K589 Irritable bowel syndrome without diarrhea: Secondary | ICD-10-CM

## 2015-04-17 DIAGNOSIS — B0223 Postherpetic polyneuropathy: Secondary | ICD-10-CM

## 2015-04-17 MED ORDER — VIBERZI 100 MG PO TABS: 100.0000 mg | ORAL_TABLET | Freq: Two times a day (BID) | ORAL | Status: DC

## 2015-04-17 MED ORDER — AMOXICILLIN-POT CLAVULANATE 875-125 MG PO TABS: 1.0000 | ORAL_TABLET | Freq: Two times a day (BID) | ORAL | Status: DC

## 2015-04-17 NOTE — Progress Notes (Signed)
Subjective:    Patient ID: Crystal [name], female    DOB: 1961/07/23, 54 y.o.   MRN: [id number]  Otalgia  There is pain in the right ear. This is a new problem. The current episode started 1 to 4 weeks ago ("Last Tuesday"). The problem occurs every few hours. The problem has been waxing and waning. There has been no fever. The pain is at a severity of 5/10. The pain is moderate. Associated symptoms include abdominal pain, coughing, diarrhea, headaches and hearing loss. Pertinent negatives include no ear discharge, rhinorrhea or sore throat. She has tried antibiotics for the symptoms. The treatment provided mild relief.  Diarrhea  This is a chronic problem. The current episode started more than 1 year ago. The problem occurs 2 to 4 times per day. The problem has been waxing and waning. The stool consistency is described as watery. Associated symptoms include abdominal pain, bloating, coughing and headaches. Pertinent negatives include no increased  flatus or myalgias. The symptoms are aggravated by dairy products. She has tried increased fluids, change of diet and anti-motility drug for the symptoms. The treatment provided mild relief. Her past medical history is significant for irritable bowel syndrome.      Review of Systems  Constitutional: Negative.   HENT: Positive for ear pain and hearing loss. Negative for ear discharge, rhinorrhea and sore throat.   Eyes: Negative.   Respiratory: Positive for cough. Negative for shortness of breath.   Cardiovascular: Negative.  Negative for palpitations.  Gastrointestinal: Positive for abdominal pain, diarrhea and bloating. Negative for flatus.  Endocrine: Negative.   Genitourinary: Negative.   Musculoskeletal: Negative for myalgias.  Neurological: Positive for headaches.  Hematological: Negative.   Psychiatric/Behavioral: Negative.   All other systems reviewed and are negative.      Objective:   Physical Exam  Constitutional: She is oriented  to person, place, and time. She appears well-developed and well-nourished. No distress.  HENT:  Head: Normocephalic and atraumatic.  Right Ear: There is tenderness. A middle ear effusion is present.  Left Ear: External ear normal.  Mouth/Throat: Oropharynx is clear and moist.  Eyes: Pupils are equal, round, and reactive to light.  Neck: Normal range of motion. Neck supple. No thyromegaly present.  Cardiovascular: Normal rate, regular rhythm, normal heart sounds and intact distal pulses.   No murmur heard. Pulmonary/Chest: Effort normal and breath sounds normal. No respiratory distress. She has no wheezes.  Abdominal: Soft. Bowel sounds are normal. She exhibits no distension. There is no tenderness.  Musculoskeletal: Normal range of motion. She exhibits no edema or tenderness.  Neurological: She is alert and oriented to person, place, and time. She has normal reflexes. No cranial nerve deficit.  Skin: Skin is warm and dry.  Psychiatric: She has a normal mood and affect. Her behavior is normal. Judgment and thought content normal.  Vitals reviewed.     BP 125/76 mmHg  Pulse 59  Temp(Src) 97.5 F (36.4 C) (Oral)  Ht 5\' 4"  (1.626 m)  Wt 131 lb (59.421 kg)  BMI 22.47 kg/m2     Assessment & Plan:  1. IBS (irritable bowel syndrome) -Diet discussed  CMP14+EGFR - VIBERZI 100 MG TABS; Take 100 mg by mouth 2 (two) times daily.  Dispense: 90 tablet; Refill: 1  2. Shingles (herpes zoster) polyneuropathy - CMP14+EGFR  3. Otitis media with effusion, right - CMP14+EGFR -Tylenol prn for pain [place] precaution discussed - amoxicillin-clavulanate (AUGMENTIN) 875-125 MG per tablet; Take 1 tablet by mouth 2 (two) times  daily.  Dispense: 14 tablet; Refill: 0   Continue all meds Labs pending Health Maintenance reviewed Diet and exercise encouraged RTO 6 months  Evelina Dun, FNP

## 2015-04-17 NOTE — Patient Instructions (Signed)
Irritable Bowel Syndrome Irritable bowel syndrome (IBS) is caused by a disturbance of normal bowel function and is a common digestive disorder. You may also hear this condition called spastic colon, mucous colitis, and irritable colon. There is no cure for IBS. However, symptoms often gradually improve or disappear with a good diet, stress management, and medicine. This condition usually appears in late adolescence or early adulthood. Women develop it twice as often as men. CAUSES  After food has been digested and absorbed in the small intestine, waste material is moved into the large intestine, or colon. In the colon, water and salts are absorbed from the undigested products coming from the small intestine. The remaining residue, or fecal material, is held for elimination. Under normal circumstances, gentle, rhythmic contractions of the bowel walls push the fecal material along the colon toward the rectum. In IBS, however, these contractions are irregular and poorly coordinated. The fecal material is either retained too long, resulting in constipation, or expelled too soon, producing diarrhea. SIGNS AND SYMPTOMS  The most common symptom of IBS is abdominal pain. It is often in the lower left side of the abdomen, but it may occur anywhere in the abdomen. The pain comes from spasms of the bowel muscles happening too much and from the buildup of gas and fecal material in the colon. This pain:  Can range from sharp abdominal cramps to a dull, continuous ache.  Often worsens soon after eating.  Is often relieved by having a bowel movement or passing gas. Abdominal pain is usually accompanied by constipation, but it may also produce diarrhea. The diarrhea often occurs right after a meal or upon waking up in the morning. The stools are often soft, watery, and flecked with mucus. Other symptoms of IBS include:  Bloating.  Loss of appetite.  Heartburn.  Backache.  Dull pain in the arms or  shoulders.  Nausea.  Burping.  Vomiting.  Gas. IBS may also cause symptoms that are unrelated to the digestive system, such as:  Fatigue.  Headaches.  Anxiety.  Shortness of breath.  Trouble concentrating.  Dizziness. These symptoms tend to come and go. DIAGNOSIS  The symptoms of IBS may seem like symptoms of other, more serious digestive disorders. Your health care provider may want to perform tests to exclude these disorders.  TREATMENT Many medicines are available to help correct bowel function or relieve bowel spasms and abdominal pain. Among the medicines available are:  Laxatives for severe constipation and to help restore normal bowel habits.  Specific antidiarrheal medicines to treat severe or lasting diarrhea.  Antispasmodic agents to relieve intestinal cramps. Your health care provider may also decide to treat you with a mild tranquilizer or sedative during unusually stressful periods in your life. Your health care provider may also prescribe antidepressant medicine. The use of this medicine has been shown to reduce pain and other symptoms of IBS. Remember that if any medicine is prescribed for you, you should take it exactly as directed. Make sure your health care provider knows how well it worked for you. HOME CARE INSTRUCTIONS   Take all medicines as directed by your health care provider.  Avoid foods that are high in fat or oils, such as heavy cream, butter, frankfurters, sausage, and other fatty meats.  Avoid foods that make you go to the bathroom, such as fruit, fruit juice, and dairy products.  Cut out carbonated drinks, chewing gum, and "gassy" foods such as beans and cabbage. This may help relieve bloating and burping.    Eat foods with bran, and drink plenty of liquids with the bran foods. This helps relieve constipation.  Keep track of what foods seem to bring on your symptoms.  Avoid emotionally charged situations or circumstances that produce  anxiety.  Start or continue exercising.  Get plenty of rest and sleep. Document Released: 11/11/2005 Document Revised: 11/16/2013 Document Reviewed: 07/01/2008 [employer] Patient Information 2015 [employer]. This information is not intended to replace advice given to you by your health care provider. Make sure you discuss any questions you have with your health care provider.  

## 2015-04-18 LAB — CMP14+EGFR
A/G RATIO: 2 (ref 1.1–2.5)
ALT: 9 IU/L (ref 0–32)
AST: 14 IU/L (ref 0–40)
Albumin: 4.5 g/dL (ref 3.5–5.5)
Alkaline Phosphatase: 135 IU/L — ABNORMAL HIGH (ref 39–117)
BILIRUBIN TOTAL: 0.2 mg/dL (ref 0.0–1.2)
BUN / CREAT RATIO: 20 (ref 9–23)
BUN: 11 mg/dL (ref 6–24)
CO2: 27 mmol/L (ref 18–29)
Calcium: 9.4 mg/dL (ref 8.7–10.2)
Chloride: 95 mmol/L — ABNORMAL LOW (ref 97–108)
Creatinine, Ser: 0.55 mg/dL — ABNORMAL LOW (ref 0.57–1.00)
GFR calc Af Amer: 124 mL/min/{1.73_m2} (ref >59)
GFR calc non Af Amer: 107 mL/min/{1.73_m2} (ref >59)
GLUCOSE: 93 mg/dL (ref 65–99)
Globulin, Total: 2.3 g/dL (ref 1.5–4.5)
Potassium: 4.4 mmol/L (ref 3.5–5.2)
SODIUM: 138 mmol/L (ref 134–144)
Total Protein: 6.8 g/dL (ref 6.0–8.5)

## 2015-04-20 ENCOUNTER — Telehealth: Payer: Self-pay | Admitting: Family

## 2015-04-20 DIAGNOSIS — H6691 Otitis media, unspecified, right ear: Secondary | ICD-10-CM

## 2015-04-27 NOTE — Telephone Encounter (Signed)
ENT referral sent per pt's request 

## 2015-04-28 ENCOUNTER — Telehealth: Payer: Self-pay | Admitting: Family

## 2015-06-15 ENCOUNTER — Ambulatory Visit (INDEPENDENT_AMBULATORY_CARE_PROVIDER_SITE_OTHER): Payer: Self-pay | Admitting: Otolaryngology

## 2015-08-05 ENCOUNTER — Encounter: Payer: Self-pay | Admitting: *Deleted

## 2015-08-05 ENCOUNTER — Encounter: Payer: Self-pay | Admitting: Family Medicine

## 2015-08-05 ENCOUNTER — Ambulatory Visit (INDEPENDENT_AMBULATORY_CARE_PROVIDER_SITE_OTHER): Payer: PRIVATE HEALTH INSURANCE | Admitting: Family Medicine

## 2015-08-05 VITALS — BP 141/84 | HR 60 | Temp 97.0°F | Ht 64.0 in | Wt 132.0 lb

## 2015-08-05 DIAGNOSIS — J029 Acute pharyngitis, unspecified: Secondary | ICD-10-CM

## 2015-08-05 DIAGNOSIS — J40 Bronchitis, not specified as acute or chronic: Secondary | ICD-10-CM | POA: Diagnosis not present

## 2015-08-05 DIAGNOSIS — R059 Cough, unspecified: Secondary | ICD-10-CM

## 2015-08-05 DIAGNOSIS — R5383 Other fatigue: Secondary | ICD-10-CM

## 2015-08-05 DIAGNOSIS — R05 Cough: Secondary | ICD-10-CM | POA: Diagnosis not present

## 2015-08-05 DIAGNOSIS — J209 Acute bronchitis, unspecified: Secondary | ICD-10-CM

## 2015-08-05 DIAGNOSIS — R5381 Other malaise: Secondary | ICD-10-CM | POA: Diagnosis not present

## 2015-08-05 LAB — POCT RAPID STREP A (OFFICE): RAPID STREP A SCREEN: NEGATIVE

## 2015-08-05 LAB — POCT INFLUENZA A/B
INFLUENZA A, POC: NEGATIVE
INFLUENZA B, POC: NEGATIVE

## 2015-08-05 MED ORDER — HYDROCOD POLST-CPM POLST ER 10-8 MG/5ML PO SUER: 5.0000 mL | Freq: Every evening | ORAL | Status: DC | PRN

## 2015-08-05 MED ORDER — UMECLIDINIUM-VILANTEROL 62.5-25 MCG/INH IN AEPB: 1.0000 | INHALATION_SPRAY | Freq: Every day | RESPIRATORY_TRACT | Status: DC

## 2015-08-05 MED ORDER — CLOTRIMAZOLE 10 MG MT TROC: 10.0000 mg | Freq: Every day | OROMUCOSAL | Status: DC

## 2015-08-05 NOTE — Progress Notes (Signed)
Subjective:    Patient ID: Crystal [name], female    DOB: 17-Aug-1961, 54 y.o.   MRN: [id number]  HPI Patient here today for cough, chills and sore throat. This started last Tuesday when she went to [facility]. The patient is feeling weak and tired and continues to have cough chest congestion sweats and chills and sore throat. She's been to urgent care at least a couple times. She has not had a chest x-ray. She is a past smoker. She just feels bad. She has almost completed her [employer] and has a couple more days left on this and also a couple more days on prednisone. At her most recent visit to urgent care she did receive an injection which was most likely a steroid injection. She has not had any other symptoms other than her respiratory tract headache and sore throat. She does have a history of irritable bowel syndrome however and taking antibiotics has affected this.        Patient Active Problem List   Diagnosis Date Noted  . IBS (irritable bowel syndrome) 04/17/2015  . Shingles (herpes zoster) polyneuropathy 04/17/2015   Outpatient Encounter Prescriptions as of 08/05/2015  Medication Sig  . cefdinir (OMNICEF) 300 MG capsule Take 300 mg by mouth 2 (two) times daily.  . Fluticasone Propionate (FLONASE NA) Place into the nose.  . gabapentin (NEURONTIN) 600 MG tablet Take 1 tablet (600 mg total) by mouth 3 (three) times daily.  . predniSONE (STERAPRED UNI-PAK 48 TAB) 10 MG (48) TBPK tablet Take by mouth daily.  Marland Kitchen PRESCRIPTION MEDICATION Brom/psc/dm   syrup  . [DISCONTINUED] amoxicillin-clavulanate (AUGMENTIN) 875-125 MG per tablet Take 1 tablet by mouth 2 (two) times daily.  . [DISCONTINUED] hyoscyamine (LEVSIN, ANASPAZ) 0.125 MG tablet Take 0.125 mg by mouth every 6 (six) hours as needed.  . [DISCONTINUED] VIBERZI 100 MG TABS Take 100 mg by mouth 2 (two) times daily.   No facility-administered encounter medications on file as of 08/05/2015.     Review of Systems    Constitutional: Positive for chills (and sweats). Fever: unsure.  HENT: Positive for congestion, ear pain (right ear) and sore throat.   Eyes: Negative.   Respiratory: Positive for cough.   Cardiovascular: Negative.   Gastrointestinal: Negative.   Endocrine: Negative.   Genitourinary: Negative.   Musculoskeletal: Negative.   Skin: Negative.   Allergic/Immunologic: Negative.   Neurological: Negative.   Hematological: Negative.   Psychiatric/Behavioral: Negative.        Objective:   Physical Exam  Constitutional: She is oriented to person, place, and time. She appears well-developed and well-nourished. She appears distressed.  HENT:  Head: Normocephalic and atraumatic.  Right Ear: External ear normal.  Left Ear: External ear normal.  Nasal congestion and redness posterior throat with a white coating in the mouth and throat. Both ears were normal.  Eyes: Conjunctivae and EOM are normal. Pupils are equal, round, and reactive to light. Right eye exhibits no discharge. Left eye exhibits no discharge. No scleral icterus.  Neck: Normal range of motion. Neck supple. No thyromegaly present.  Cardiovascular: Normal rate, regular rhythm and normal heart sounds.   No murmur heard. Pulmonary/Chest: Effort normal. No respiratory distress. She has no wheezes. She has no rales. She exhibits no tenderness.  Tight congested cough with rhonchi and no rales were heard. Maybe some scattered wheezes with deep breathing and exhalation  Musculoskeletal: Normal range of motion. She exhibits no edema.  Lymphadenopathy:    She has no cervical  adenopathy.  Neurological: She is alert and oriented to person, place, and time.  Skin: Skin is warm and dry. No rash noted.  Psychiatric: She has a normal mood and affect. Her behavior is normal. Judgment and thought content normal.  Nursing note and vitals reviewed.   BP 141/84 mmHg  Pulse 60  Temp(Src) 97 F (36.1 C) (Oral)  Ht 5' 4" (1.626 m)  Wt 132 lb  (59.875 kg)  BMI 22.65 kg/m2  Results for orders placed or performed in visit on 08/05/15  POCT rapid strep A  Result Value Ref Range   Rapid Strep A Screen Negative Negative  POCT Influenza A/B  Result Value Ref Range   Influenza A, POC Negative Negative   Influenza B, POC Negative Negative        Assessment & Plan:  1. Cough -Continue Mucinex, blue and white in color one twice daily with a large glass of water for cough and congestion -Tussionex will be called in for severe cough as needed - POCT Influenza A/B - CBC with Differential/Platelet - BMP8+EGFR - Thyroid Panel With TSH - DG Chest 2 View; Future  2. Sore throat -Use Mycelex troches as prescribed - POCT rapid strep A - POCT Influenza A/B - Culture, Group A Strep - CBC with Differential/Platelet  3. Bronchitis with bronchospasm -Use albuterol inhaler as needed -Use anoro inhaler as directed -Finish antibiotic and prednisone that patient is currently taking -Return to clinic on Monday for chest x-ray -Remain out of work -Return to clinic on Tuesday for recheck --Use aoro inhaler 1 puff once daily--- a sample of this was given to patient to use.  4. Malaise and fatigue -BMP, CBC and thyroid profile as noted above  Meds ordered this encounter  Medications  . predniSONE (STERAPRED UNI-PAK 48 TAB) 10 MG (48) TBPK tablet    Sig: Take by mouth daily.  . cefdinir (OMNICEF) 300 MG capsule    Sig: Take 300 mg by mouth 2 (two) times daily.  . clotrimazole (MYCELEX) 10 MG troche    Sig: Take 1 tablet (10 mg total) by mouth 5 (five) times daily.    Dispense:  70 tablet    Refill:  1  . Umeclidinium-Vilanterol 62.5-25 MCG/INH AEPB    Sig: Inhale 1 puff into the lungs daily.    Dispense:  60 each    Refill:  0  . chlorpheniramine-HYDROcodone (TUSSIONEX PENNKINETIC ER) 10-8 MG/5ML SUER    Sig: Take 5 mLs by mouth at bedtime as needed for cough.    Dispense:  140 mL    Refill:  0   Patient Instructions  Drink  plenty of fluids Get rest Finish Omnicef and prednisone Use Mycelex troche as directed for thrush Use inhaler 1 puff daily Use albuterol inhaler as needed for wheezing Take Mucinex, blue and white in color, 1 twice daily for cough and congestion with a large glass of water whether coughing or not Return to clinic on Monday for chest x-ray Return to clinic on Tuesday for recheck Remain out of work most likely through the next week We will review lab work with you as soon as it becomes available Take Tussionex cough syrup if needed for severe cough at nighttime only   Arrie Senate MD

## 2015-08-05 NOTE — Patient Instructions (Signed)
Drink plenty of fluids Get rest Finish Omnicef and prednisone Use Mycelex troche as directed for thrush Use inhaler 1 puff daily Use albuterol inhaler as needed for wheezing Take Mucinex, blue and white in color, 1 twice daily for cough and congestion with a large glass of water whether coughing or not Return to clinic on Monday for chest x-ray Return to clinic on Tuesday for recheck Remain out of work most likely through the next week We will review lab work with you as soon as it becomes available Take Tussionex cough syrup if needed for severe cough at nighttime only

## 2015-08-06 LAB — BMP8+EGFR
BUN / CREAT RATIO: 16 (ref 9–23)
BUN: 13 mg/dL (ref 6–24)
CALCIUM: 10.3 mg/dL — AB (ref 8.7–10.2)
CO2: 22 mmol/L (ref 18–29)
Chloride: 98 mmol/L (ref 97–108)
Creatinine, Ser: 0.79 mg/dL (ref 0.57–1.00)
GFR, EST AFRICAN AMERICAN: 99 mL/min/{1.73_m2} (ref >59)
GFR, EST NON AFRICAN AMERICAN: 86 mL/min/{1.73_m2} (ref >59)
Glucose: 89 mg/dL (ref 65–99)
Potassium: 5.4 mmol/L — ABNORMAL HIGH (ref 3.5–5.2)
Sodium: 147 mmol/L — ABNORMAL HIGH (ref 134–144)

## 2015-08-06 LAB — CBC WITH DIFFERENTIAL/PLATELET
BASOS: 0 %
Basophils Absolute: 0 10*3/uL (ref 0.0–0.2)
EOS (ABSOLUTE): 0.2 10*3/uL (ref 0.0–0.4)
EOS: 1 %
HEMATOCRIT: 41.3 % (ref 34.0–46.6)
Hemoglobin: 13.6 g/dL (ref 11.1–15.9)
IMMATURE GRANS (ABS): 0 10*3/uL (ref 0.0–0.1)
IMMATURE GRANULOCYTES: 0 %
Lymphocytes Absolute: 6.5 10*3/uL — ABNORMAL HIGH (ref 0.7–3.1)
Lymphs: 38 %
MCH: 29.2 pg (ref 26.6–33.0)
MCHC: 32.9 g/dL (ref 31.5–35.7)
MCV: 89 fL (ref 79–97)
MONOS ABS: 1 10*3/uL — AB (ref 0.1–0.9)
Monocytes: 6 %
NEUTROS ABS: 9.2 10*3/uL — AB (ref 1.4–7.0)
NEUTROS PCT: 55 %
Platelets: 325 10*3/uL (ref 150–379)
RBC: 4.65 x10E6/uL (ref 3.77–5.28)
RDW: 14.7 % (ref 12.3–15.4)
WBC: 17 10*3/uL — ABNORMAL HIGH (ref 3.4–10.8)

## 2015-08-06 LAB — THYROID PANEL WITH TSH
FREE THYROXINE INDEX: 2.2 (ref 1.2–4.9)
T3 UPTAKE RATIO: 24 % (ref 24–39)
T4, Total: 9.3 ug/dL (ref 4.5–12.0)
TSH: 2.41 u[IU]/mL (ref 0.450–4.500)

## 2015-08-07 ENCOUNTER — Other Ambulatory Visit (INDEPENDENT_AMBULATORY_CARE_PROVIDER_SITE_OTHER): Payer: PRIVATE HEALTH INSURANCE

## 2015-08-07 DIAGNOSIS — R059 Cough, unspecified: Secondary | ICD-10-CM

## 2015-08-07 DIAGNOSIS — R05 Cough: Secondary | ICD-10-CM | POA: Diagnosis not present

## 2015-08-07 LAB — CULTURE, GROUP A STREP: Strep A Culture: NEGATIVE

## 2015-08-08 ENCOUNTER — Encounter: Payer: Self-pay | Admitting: Family Medicine

## 2015-08-08 ENCOUNTER — Ambulatory Visit (INDEPENDENT_AMBULATORY_CARE_PROVIDER_SITE_OTHER): Payer: PRIVATE HEALTH INSURANCE | Admitting: Family Medicine

## 2015-08-08 VITALS — BP 118/71 | HR 57 | Temp 97.2°F | Ht 64.0 in | Wt 135.0 lb

## 2015-08-08 DIAGNOSIS — R05 Cough: Secondary | ICD-10-CM

## 2015-08-08 DIAGNOSIS — R059 Cough, unspecified: Secondary | ICD-10-CM

## 2015-08-08 DIAGNOSIS — M2550 Pain in unspecified joint: Secondary | ICD-10-CM | POA: Diagnosis not present

## 2015-08-08 DIAGNOSIS — D72829 Elevated white blood cell count, unspecified: Secondary | ICD-10-CM | POA: Diagnosis not present

## 2015-08-08 DIAGNOSIS — R5381 Other malaise: Secondary | ICD-10-CM

## 2015-08-08 NOTE — Patient Instructions (Signed)
Continue to use the inhaler once daily Continue to use the Mycelex troches as directed Continue to drink plenty of water and fluids and take Mucinex maximum strength 1 twice daily with a large glass of water Follow-up the end of the week and get a repeat CBC and sedimentation rate Avoid irritating environments as much as possible

## 2015-08-08 NOTE — Addendum Note (Signed)
Addended by: Magdalene River on: 08/08/2015 02:36 PM   Modules accepted: Orders

## 2015-08-08 NOTE — Progress Notes (Signed)
Subjective:    Patient ID: Crystal [name], female    DOB: 31-Oct-1961, 54 y.o.   MRN: [id number]  HPI Pt here for 3 day follow up on bronchitis. She states she is feeling some better and she has finished all of the omnicef and prednisone. The patient is feeling some better and has actually gone back to work. She is currently using her anoro and still taking medication for thrush. All of her recent lab work and x-rays will be reviewed with her today. Most outstanding abnormal finding was her elevated white blood cell count and this most likely could be secondary to her having been on the prednisone. Chest x-ray was read as normal. She has finished the Omnicef and the prednisone. She still has some cough and congestion. The patient does also complain of some generalized arthralgias but they have not been any worse recently than in the past.      Patient Active Problem List   Diagnosis Date Noted  . IBS (irritable bowel syndrome) 04/17/2015  . 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 chlorpheniramine-HYDROcodone (TUSSIONEX PENNKINETIC ER) 10-8 MG/5ML SUER Take 5 mLs by mouth at bedtime as needed for cough.  . clotrimazole (MYCELEX) 10 MG troche Take 1 tablet (10 mg total) by mouth 5 (five) times daily.  . Fluticasone Propionate (FLONASE NA) Place into the nose.  . gabapentin (NEURONTIN) 600 MG tablet Take 1 tablet (600 mg total) by mouth 3 (three) times daily.  Marland Kitchen PRESCRIPTION MEDICATION Brom/psc/dm   syrup  . Umeclidinium-Vilanterol 62.5-25 MCG/INH AEPB Inhale 1 puff into the lungs daily.  . [DISCONTINUED] cefdinir (OMNICEF) 300 MG capsule Take 300 mg by mouth 2 (two) times daily.  . [DISCONTINUED] predniSONE (STERAPRED UNI-PAK 48 TAB) 10 MG (48) TBPK tablet Take by mouth daily.   No facility-administered encounter medications on file as of 08/08/2015.      Review of Systems  Constitutional: Negative.     HENT: Positive for congestion and voice change.   Eyes: Negative.   Respiratory: Positive for cough.   Cardiovascular: Negative.   Gastrointestinal: Positive for nausea (slight ).  Endocrine: Negative.   Genitourinary: Negative.   Musculoskeletal: Negative.   Skin: Negative.   Allergic/Immunologic: Negative.   Neurological: Negative.   Hematological: Negative.   Psychiatric/Behavioral: Negative.        Objective:   Physical Exam  Constitutional: She is oriented to person, place, and time. She appears well-developed and well-nourished. No distress.  HENT:  Head: Normocephalic and atraumatic.  Right Ear: External ear normal.  Left Ear: External ear normal.  Nose: Nose normal.  Mouth/Throat: Oropharynx is clear and moist. No oropharyngeal exudate.  Eyes: Conjunctivae and EOM are normal. Pupils are equal, round, and reactive to light. Right eye exhibits no discharge. Left eye exhibits no discharge. No scleral icterus.  Neck: Normal range of motion. Neck supple. No thyromegaly present.  Cardiovascular: Normal rate, regular rhythm and normal heart sounds.   No murmur heard. Pulmonary/Chest: Effort normal. No respiratory distress. She has no wheezes. She has no rales.  The patient has a somewhat tight cough but otherwise no rales wheezes  Abdominal: Bowel sounds are normal.  Musculoskeletal: Normal range of motion. She exhibits no edema.  Lymphadenopathy:    She has no cervical adenopathy.  Neurological: She is alert and oriented to person, place, and time.  Skin: Skin is warm and dry. No rash noted.  Psychiatric: She has a  normal mood and affect. Her behavior is normal. Judgment and thought content normal.  Nursing note and vitals reviewed.  BP 118/71 mmHg  Pulse 57  Temp(Src) 97.2 F (36.2 C) (Oral)  Ht  (1.626 m)  Wt 135 lb (61.236 kg)  BMI 23.16 kg/m2        Assessment & Plan:  1. Elevated white blood cell count -Repeat CBC and sedimentation rate the end of  this week at patient's convenience  2. Cough -Continue to use inhaler 1 puff daily. Continue to take Mucinex maximum strength 1 twice daily for cough and congestion. Continue to drink plenty of fluids and keep well hydrated  3. Malaise -It is much rest as possible and take Tylenol for aches pains and fever  Patient Instructions  Continue to use the inhaler once daily Continue to use the Mycelex troches as directed Continue to drink plenty of water and fluids and take Mucinex maximum strength 1 twice daily with a large glass of water Follow-up the end of the week and get a repeat CBC and sedimentation rate Avoid irritating environments as much as possible   Nyra Capes MD

## 2015-08-12 ENCOUNTER — Other Ambulatory Visit: Payer: PRIVATE HEALTH INSURANCE

## 2015-08-12 ENCOUNTER — Encounter: Payer: Self-pay | Admitting: Family

## 2015-08-12 ENCOUNTER — Ambulatory Visit (INDEPENDENT_AMBULATORY_CARE_PROVIDER_SITE_OTHER): Payer: PRIVATE HEALTH INSURANCE | Admitting: Family

## 2015-08-12 VITALS — BP 117/75 | HR 62 | Temp 97.2°F | Ht 64.0 in | Wt 133.6 lb

## 2015-08-12 DIAGNOSIS — M2550 Pain in unspecified joint: Secondary | ICD-10-CM

## 2015-08-12 DIAGNOSIS — R059 Cough, unspecified: Secondary | ICD-10-CM

## 2015-08-12 DIAGNOSIS — D72829 Elevated white blood cell count, unspecified: Secondary | ICD-10-CM

## 2015-08-12 DIAGNOSIS — B370 Candidal stomatitis: Secondary | ICD-10-CM

## 2015-08-12 DIAGNOSIS — J209 Acute bronchitis, unspecified: Secondary | ICD-10-CM | POA: Diagnosis not present

## 2015-08-12 DIAGNOSIS — R5381 Other malaise: Secondary | ICD-10-CM

## 2015-08-12 DIAGNOSIS — R05 Cough: Secondary | ICD-10-CM

## 2015-08-12 MED ORDER — BENZONATATE 200 MG PO CAPS: 200.0000 mg | ORAL_CAPSULE | Freq: Three times a day (TID) | ORAL | Status: DC | PRN

## 2015-08-12 MED ORDER — NYSTATIN 100000 UNIT/ML MT SUSP: 5.0000 mL | Freq: Four times a day (QID) | OROMUCOSAL | Status: DC

## 2015-08-12 NOTE — Progress Notes (Signed)
Subjective:    Patient ID: Crystal [name], female    DOB: [birth year], 55 y.o.   MRN: [id number]   PT presents to the office today for recurrent oral thrush. Pt her throat continues to feel "raw and bruised". Pt has been taking Mycelex 10 mg 5 times a day with no relief. Pt states she has been gargling with salt water several times a day. Pt states she continues to have productive cough. Pt states this has improved, but has not resolved. Pt was given Omnicef, prednisone dose pack, and continues with mucinex BID. Pt states these seemed to help.  Cough Pertinent negatives include no headaches or shortness of breath.      Review of Systems  Constitutional: Negative.   HENT: Negative.   Eyes: Negative.   Respiratory: Positive for cough. Negative for shortness of breath.   Cardiovascular: Negative.  Negative for palpitations.  Gastrointestinal: Negative.   Endocrine: Negative.   Genitourinary: Negative.   Musculoskeletal: Negative.   Neurological: Negative.  Negative for headaches.  Hematological: Negative.   Psychiatric/Behavioral: Negative.   All other systems reviewed and are negative.      Objective:   Physical Exam  Constitutional: She is oriented to person, place, and time. She appears well-developed and well-nourished. No distress.  HENT:  Head: Normocephalic and atraumatic.  Right Ear: External ear normal.  Left Ear: External ear normal.  Nasal passage erythemas with mild swelling  Oropharynx erytheamas  Eyes: Pupils are equal, round, and reactive to light.  Neck: Normal range of motion. Neck supple. No thyromegaly present.  Cardiovascular: Normal rate, regular rhythm, normal heart sounds and intact distal pulses.   No murmur heard. Pulmonary/Chest: Effort normal. No respiratory distress. She has no wheezes.  Coarse nonproductive cough   Abdominal: Soft. Bowel sounds are normal. She exhibits no distension. There is no tenderness.  Musculoskeletal: Normal range of  motion. She exhibits no edema or tenderness.  Neurological: She is alert and oriented to person, place, and time. She has normal reflexes. No cranial nerve deficit.  Skin: Skin is warm and dry.  Psychiatric: She has a normal mood and affect. Her behavior is normal. Judgment and thought content normal.  Vitals reviewed.   BP 117/75 mmHg  Pulse 62  Temp(Src) 97.2 F (36.2 C) (Oral)  Ht  (1.626 m)  Wt 133 lb 9.6 oz (60.601 kg)  BMI 22.92 kg/m2       Assessment & Plan:  1. Oral thrush -Swish and swallow -Rinse after meals -Avoid eating or drinking anything citrius Continue with daily yogurt - nystatin (MYCOSTATIN) 100000 UNIT/ML suspension; Take 5 mLs (500,000 Units total) by mouth 4 (four) times daily.  Dispense: 60 mL; Refill: 0  2. Acute bronchitis, unspecified organism -- Take meds as prescribed - Use a cool mist humidifier  -Use saline nose sprays frequently -Saline irrigations of the nose can be very helpful if done frequently.  * 4X daily for 1 week*  * Use of a nettie pot can be helpful with this. Follow directions with this* -Force fluids -For any cough or congestion  Use plain Mucinex- regular strength or max strength is fine   * Children- consult with Pharmacist for dosing -For fever or aces or pains- take tylenol or ibuprofen appropriate for age and weight.  * for fevers greater than 101 orally you may alternate ibuprofen and tylenol every  3 hours. -Throat lozenges if help - benzonatate (TESSALON) 200 MG capsule; Take 1 capsule (200 mg total) by  mouth 3 (three) times daily as needed.  Dispense: 30 capsule; Refill: 1  Jannifer Rodneyhristy Hawks, FNP

## 2015-08-12 NOTE — Patient Instructions (Signed)

## 2015-08-13 LAB — CBC WITH DIFFERENTIAL/PLATELET
Basophils Absolute: 0.1 10*3/uL (ref 0.0–0.2)
Basos: 0 %
EOS (ABSOLUTE): 0.2 10*3/uL (ref 0.0–0.4)
EOS: 2 %
HEMATOCRIT: 41.2 % (ref 34.0–46.6)
HEMOGLOBIN: 13.9 g/dL (ref 11.1–15.9)
IMMATURE GRANULOCYTES: 0 %
Immature Grans (Abs): 0 10*3/uL (ref 0.0–0.1)
LYMPHS ABS: 2.4 10*3/uL (ref 0.7–3.1)
Lymphs: 21 %
MCH: 30.3 pg (ref 26.6–33.0)
MCHC: 33.7 g/dL (ref 31.5–35.7)
MCV: 90 fL (ref 79–97)
MONOCYTES: 8 %
Monocytes Absolute: 0.9 10*3/uL (ref 0.1–0.9)
NEUTROS PCT: 69 %
Neutrophils Absolute: 8 10*3/uL — ABNORMAL HIGH (ref 1.4–7.0)
Platelets: 284 10*3/uL (ref 150–379)
RBC: 4.59 x10E6/uL (ref 3.77–5.28)
RDW: 14.6 % (ref 12.3–15.4)
WBC: 11.6 10*3/uL — AB (ref 3.4–10.8)

## 2015-08-13 LAB — SEDIMENTATION RATE: Sed Rate: 2 mm/hr (ref 0–40)

## 2015-08-25 ENCOUNTER — Encounter: Payer: Self-pay | Admitting: Family Medicine

## 2015-08-25 ENCOUNTER — Ambulatory Visit (INDEPENDENT_AMBULATORY_CARE_PROVIDER_SITE_OTHER): Payer: PRIVATE HEALTH INSURANCE | Admitting: Family Medicine

## 2015-08-25 VITALS — BP 111/75 | HR 61 | Temp 98.2°F | Ht 64.0 in | Wt 135.8 lb

## 2015-08-25 DIAGNOSIS — R350 Frequency of micturition: Secondary | ICD-10-CM

## 2015-08-25 LAB — POCT UA - MICROSCOPIC ONLY
BACTERIA, U MICROSCOPIC: NEGATIVE
Casts, Ur, LPF, POC: NEGATIVE
Crystals, Ur, HPF, POC: NEGATIVE
MUCUS UA: NEGATIVE
RBC, urine, microscopic: NEGATIVE
WBC, UR, HPF, POC: NEGATIVE
Yeast, UA: NEGATIVE

## 2015-08-25 LAB — POCT URINALYSIS DIPSTICK
BILIRUBIN UA: NEGATIVE
Blood, UA: NEGATIVE
GLUCOSE UA: NEGATIVE
Ketones, UA: NEGATIVE
LEUKOCYTES UA: NEGATIVE
NITRITE UA: NEGATIVE
Protein, UA: NEGATIVE
Spec Grav, UA: <=1.005
Urobilinogen, UA: NEGATIVE
pH, UA: 7.5

## 2015-08-25 MED ORDER — CEFTRIAXONE SODIUM 1 G IJ SOLR
1.0000 g | Freq: Once | INTRAMUSCULAR | Status: AC
  Administered 2015-08-25: 1 g via INTRAMUSCULAR

## 2015-08-25 NOTE — Patient Instructions (Signed)

## 2015-08-25 NOTE — Progress Notes (Signed)
BP 111/75 mmHg  Pulse 61  Temp(Src) 98.2 F (36.8 C) (Oral)  Ht  (1.626 m)  Wt 135 lb 12.8 oz (61.598 kg)  BMI 23.30 kg/m2   Subjective:    Patient ID: Crystal [name], female    DOB: [birth year], 54 y.o.   MRN: [id number]  HPI: Crystal [name] is a 54 y.o. female presenting on 08/25/2015 for Urinary Tract Infection   HPI Urinary frequency Patient has a one-week history of lower abdominal discomfort and urinary frequency. She denies any dysuria or burning when she urinates but does admit to feeling like she has to go right after she just emptied her bladder. This is been going on for about a week. She has not had a lot of UTIs in the past maybe 1 or 2 that she can think of. This is similar to her previous UTIs. She also describes left lower quadrant abdominal discomfort with this. She denies any diarrhea or constipation. She denies any vaginal discharge or bleeding.  Relevant past medical, surgical, family and social history reviewed and updated as indicated. Interim medical history since our last visit reviewed. Allergies and medications reviewed and updated.  Review of Systems  Constitutional: Negative for fever and chills.  HENT: Negative for congestion, ear discharge and ear pain.   Eyes: Negative for redness and visual disturbance.  Respiratory: Negative for chest tightness and shortness of breath.   Cardiovascular: Negative for chest pain and leg swelling.  Gastrointestinal: Positive for abdominal pain. Negative for nausea, vomiting, diarrhea, constipation, blood in stool and abdominal distention.  Genitourinary: Positive for frequency. Negative for dysuria, decreased urine volume, vaginal bleeding, vaginal discharge, difficulty urinating, vaginal pain and pelvic pain.  Musculoskeletal: Negative for back pain and gait problem.  Skin: Negative for rash.  Neurological: Negative for light-headedness and headaches.  Psychiatric/Behavioral: Negative for behavioral problems and  agitation.  All other systems reviewed and are negative.   Per HPI unless specifically indicated above     Medication List       This list is accurate as of: 08/25/15 11:30 AM.  Always use your most recent med list.               benzonatate 200 MG capsule  Commonly known as:  TESSALON  Take 1 capsule (200 mg total) by mouth 3 (three) times daily as needed.     chlorpheniramine-HYDROcodone 10-8 MG/5ML Suer  Commonly known as:  TUSSIONEX PENNKINETIC ER  Take 5 mLs by mouth at bedtime as needed for cough.     clotrimazole 10 MG troche  Commonly known as:  MYCELEX  Take 1 tablet (10 mg total) by mouth 5 (five) times daily.     FLONASE NA  Place into the nose.     gabapentin 600 MG tablet  Commonly known as:  NEURONTIN  Take 1 tablet (600 mg total) by mouth 3 (three) times daily.     nystatin 100000 UNIT/ML suspension  Commonly known as:  MYCOSTATIN  Take 5 mLs (500,000 Units total) by mouth 4 (four) times daily.     PRESCRIPTION MEDICATION  Brom/psc/dm   syrup     Umeclidinium-Vilanterol 62.5-25 MCG/INH Aepb  Inhale 1 puff into the lungs daily.           Objective:    BP 111/75 mmHg  Pulse 61  Temp(Src) 98.2 F (36.8 C) (Oral)  Ht  (1.626 m)  Wt 135 lb 12.8 oz (61.598 kg)  BMI 23.30 kg/m2  Wt  Readings from Last 3 Encounters:  08/25/15 135 lb 12.8 oz (61.598 kg)  08/12/15 133 lb 9.6 oz (60.601 kg)  08/08/15 135 lb (61.236 kg)    Physical Exam  Constitutional: She is oriented to person, place, and time. She appears well-developed and well-nourished. No distress.  Eyes: Conjunctivae and EOM are normal.  Cardiovascular: Normal rate, regular rhythm, normal heart sounds and intact distal pulses.   No murmur heard. Pulmonary/Chest: Effort normal and breath sounds normal. No respiratory distress. She has no wheezes.  Abdominal: Soft. Normal appearance and bowel sounds are normal. She exhibits no mass. There is tenderness (mild suprapubic tenderness) in  the suprapubic area. There is no rebound, no guarding and no CVA tenderness. No hernia.  Musculoskeletal: Normal range of motion. She exhibits no edema or tenderness.  Neurological: She is alert and oriented to person, place, and time. Coordination normal.  Skin: Skin is warm and dry. No rash noted. She is not diaphoretic.  Psychiatric: She has a normal mood and affect. Her behavior is normal.  Vitals reviewed.   Results for orders placed or performed in visit on 08/25/15  POCT UA - Microscopic Only  Result Value Ref Range   WBC, Ur, HPF, POC neg    RBC, urine, microscopic neg    Bacteria, U Microscopic neg    Mucus, UA neg    Epithelial cells, urine per micros few    Crystals, Ur, HPF, POC neg    Casts, Ur, LPF, POC neg    Yeast, UA neg   POCT urinalysis dipstick  Result Value Ref Range   Color, UA gold    Clarity, UA clear    Glucose, UA neg    Bilirubin, UA neg    Ketones, UA neg    Spec Grav, UA <=1.005    Blood, UA neg    pH, UA 7.5    Protein, UA neg    Urobilinogen, UA negative    Nitrite, UA neg    Leukocytes, UA Negative Negative      Assessment & Plan:       Problem List Items Addressed This Visit    None    Visit Diagnoses    Urinary frequency    -  Primary    Been going on for one week, will treat with Rocephin. We'll also send off urine culture.    Relevant Medications    cefTRIAXone (ROCEPHIN) injection 1 g    Other Relevant Orders    POCT UA - Microscopic Only (Completed)    POCT urinalysis dipstick (Completed)    Urine culture        Follow up plan: Return if symptoms worsen or fail to improve.  Arville Care, MD [facility] 08/25/2015, 11:30 AM

## 2015-08-26 LAB — URINE CULTURE: ORGANISM ID, BACTERIA: NO GROWTH

## 2015-08-29 ENCOUNTER — Telehealth: Payer: Self-pay | Admitting: Family Medicine

## 2015-08-29 NOTE — Telephone Encounter (Signed)
Left detailed message that culture came back no growth so negative for infection, to CB with any further questions or concerns.

## 2015-08-31 ENCOUNTER — Other Ambulatory Visit (INDEPENDENT_AMBULATORY_CARE_PROVIDER_SITE_OTHER): Payer: Self-pay | Admitting: Otolaryngology

## 2015-08-31 DIAGNOSIS — J329 Chronic sinusitis, unspecified: Secondary | ICD-10-CM

## 2015-09-06 ENCOUNTER — Ambulatory Visit (HOSPITAL_COMMUNITY): Payer: PRIVATE HEALTH INSURANCE

## 2015-11-28 ENCOUNTER — Telehealth: Payer: Self-pay | Admitting: Family Medicine

## 2015-11-29 NOTE — Telephone Encounter (Signed)
Pt reported

## 2015-12-26 ENCOUNTER — Encounter: Payer: Self-pay | Admitting: Family

## 2015-12-26 ENCOUNTER — Ambulatory Visit (INDEPENDENT_AMBULATORY_CARE_PROVIDER_SITE_OTHER): Payer: PRIVATE HEALTH INSURANCE | Admitting: Family

## 2015-12-26 VITALS — BP 111/74 | HR 62 | Temp 97.2°F | Ht 64.0 in | Wt 137.4 lb

## 2015-12-26 DIAGNOSIS — M5412 Radiculopathy, cervical region: Secondary | ICD-10-CM | POA: Diagnosis not present

## 2015-12-26 MED ORDER — NAPROXEN 500 MG PO TABS: 500.0000 mg | ORAL_TABLET | Freq: Two times a day (BID) | ORAL | Status: DC

## 2015-12-26 MED ORDER — KETOROLAC TROMETHAMINE 60 MG/2ML IM SOLN
60.0000 mg | Freq: Once | INTRAMUSCULAR | Status: AC
  Administered 2015-12-26: 60 mg via INTRAMUSCULAR

## 2015-12-26 MED ORDER — CYCLOBENZAPRINE HCL 5 MG PO TABS: 5.0000 mg | ORAL_TABLET | Freq: Three times a day (TID) | ORAL | Status: DC | PRN

## 2015-12-26 MED ORDER — METHYLPREDNISOLONE ACETATE 80 MG/ML IJ SUSP
80.0000 mg | Freq: Once | INTRAMUSCULAR | Status: AC
  Administered 2015-12-26: 80 mg via INTRAMUSCULAR

## 2015-12-26 NOTE — Progress Notes (Addendum)
Subjective:    Patient ID: Crystal [name], female    DOB: 06/10/1961, 55 y.o.   MRN: [id number]  PT presents to the office today with neck and shoulder pain that has been going on for 2 weeks. Pt states she has had shingles in the past in the same area and has "nerve pain" in this area intermittently. Pt states she takes gabapentin 600 mg TID which seems to help. Pt states she has taken motrin with mild relief. PT states she is having constant pain of 5-10 out 10. Pt denies any trauma to this area recently.  Neck Pain  Pertinent negatives include no headaches.  Shoulder Pain  The pain is present in the neck and left shoulder. This is a recurrent problem. The current episode started 1 to 4 weeks ago. There has been no history of extremity trauma. The problem occurs constantly. The problem has been waxing and waning. The pain is at a severity of 5/10. The pain is moderate. Pertinent negatives include no inability to bear weight or itching. She has tried acetaminophen, rest, NSAIDS, heat and cold for the symptoms. The treatment provided mild relief.      Review of Systems  Constitutional: Negative.   HENT: Negative.   Eyes: Negative.   Respiratory: Negative.  Negative for shortness of breath.   Cardiovascular: Negative.  Negative for palpitations.  Gastrointestinal: Negative.   Endocrine: Negative.   Genitourinary: Negative.   Musculoskeletal: Positive for neck pain.  Skin: Negative for itching.  Neurological: Negative.  Negative for headaches.  Hematological: Negative.   Psychiatric/Behavioral: Negative.   All other systems reviewed and are negative.      Objective:   Physical Exam  Constitutional: She is oriented to person, place, and time. She appears well-developed and well-nourished. No distress.  HENT:  Head: Normocephalic and atraumatic.  Eyes: Pupils are equal, round, and reactive to light.  Neck: Normal range of motion. Neck supple. No thyromegaly present.    Cardiovascular: Normal rate, regular rhythm, normal heart sounds and intact distal pulses.   No murmur heard. Pulmonary/Chest: Effort normal and breath sounds normal. No respiratory distress. She has no wheezes.  Abdominal: Soft. Bowel sounds are normal. She exhibits no distension. There is no tenderness.  Musculoskeletal: Normal range of motion. She exhibits no edema or tenderness.  Neurological: She is alert and oriented to person, place, and time. She has normal reflexes. No cranial nerve deficit.  Skin: Skin is warm and dry.  Psychiatric: She has a normal mood and affect. Her behavior is normal. Judgment and thought content normal.  Vitals reviewed.   BP 111/74 mmHg  Pulse 62  Temp(Src) 97.2 F (36.2 C) (Oral)  Ht  (1.626 m)  Wt 137 lb 6.4 oz (62.324 kg)  BMI 23.57 kg/m2       Assessment & Plan:  1. Cervical radiculitis -Continue Gabapentin  -Ice or heat as needed -Sedation precaution discussed  -No other NSAID's while taking naprosyn  - ketorolac (TORADOL) injection 60 mg; Inject 2 mLs (60 mg total) into the muscle once. - methylPREDNISolone acetate (DEPO-MEDROL) injection 80 mg; Inject 1 mL (80 mg total) into the muscle once. - naproxen (NAPROSYN) 500 MG tablet; Take 1 tablet (500 mg total) by mouth 2 (two) times daily with a meal.  Dispense: 60 tablet; Refill: 1 - cyclobenzaprine (FLEXERIL) 5 MG tablet; Take 1 tablet (5 mg total) by mouth 3 (three) times daily as needed for muscle spasms.  Dispense: 30 tablet; Refill: 0  Evelina Dun, FNP

## 2015-12-26 NOTE — Patient Instructions (Signed)

## 2016-01-05 ENCOUNTER — Encounter: Payer: Self-pay | Admitting: Family

## 2016-01-05 ENCOUNTER — Telehealth: Payer: Self-pay | Admitting: Family

## 2016-01-05 ENCOUNTER — Ambulatory Visit (INDEPENDENT_AMBULATORY_CARE_PROVIDER_SITE_OTHER): Payer: PRIVATE HEALTH INSURANCE | Admitting: Family

## 2016-01-05 ENCOUNTER — Ambulatory Visit (INDEPENDENT_AMBULATORY_CARE_PROVIDER_SITE_OTHER): Payer: PRIVATE HEALTH INSURANCE

## 2016-01-05 VITALS — BP 112/66 | HR 62 | Temp 97.5°F | Ht 64.0 in | Wt 135.8 lb

## 2016-01-05 DIAGNOSIS — M542 Cervicalgia: Secondary | ICD-10-CM

## 2016-01-05 DIAGNOSIS — M5412 Radiculopathy, cervical region: Secondary | ICD-10-CM

## 2016-01-05 MED ORDER — MELOXICAM 15 MG PO TABS: 15.0000 mg | ORAL_TABLET | Freq: Every day | ORAL | Status: DC

## 2016-01-05 MED ORDER — PREDNISONE 10 MG (21) PO TBPK: 10.0000 mg | ORAL_TABLET | Freq: Every day | ORAL | Status: DC

## 2016-01-05 NOTE — Progress Notes (Signed)
Subjective:    Patient ID: Crystal [name], female    DOB: 05/26/1961, 55 y.o.   MRN: [id number]  Pt presents to the office today for recurrent neck pain. Pt was seen in the office on 12/26/15 and given IM of Toradol and Depo-Medrol, rx of flexeril, and naprosyn. PT states it seemed to help, but the last few days it seems to be coming back. PT denies any injury that she knows of.  Neck Pain  This is a recurrent problem. The current episode started more than 1 month ago. The problem occurs intermittently. The problem has been waxing and waning. The pain is associated with nothing. The pain is present in the midline. The quality of the pain is described as aching. The pain is at a severity of 8/10. The pain is mild. The symptoms are aggravated by position. Associated symptoms include headaches and a visual change. Pertinent negatives include no fever, leg pain, photophobia, syncope or weakness. She has tried muscle relaxants, ice, bed rest and NSAIDs for the symptoms. The treatment provided moderate relief.      Review of Systems  Constitutional: Negative.  Negative for fever.  Eyes: Negative.  Negative for photophobia.  Respiratory: Negative.  Negative for shortness of breath.   Cardiovascular: Negative.  Negative for palpitations and syncope.  Gastrointestinal: Negative.   Endocrine: Negative.   Genitourinary: Negative.   Musculoskeletal: Positive for neck pain.  Neurological: Positive for headaches. Negative for weakness.  Hematological: Negative.   Psychiatric/Behavioral: Negative.   All other systems reviewed and are negative.      Objective:   Physical Exam  Constitutional: She is oriented to person, place, and time. She appears well-developed and well-nourished. No distress.  HENT:  Head: Normocephalic and atraumatic.  Eyes: Pupils are equal, round, and reactive to light.  Neck: Normal range of motion. Neck supple. No thyromegaly present.  Cardiovascular: Normal rate, regular  rhythm, normal heart sounds and intact distal pulses.   No murmur heard. Pulmonary/Chest: Effort normal and breath sounds normal. No respiratory distress. She has no wheezes.  Abdominal: Soft. Bowel sounds are normal. She exhibits no distension. There is no tenderness.  Musculoskeletal: Normal range of motion. She exhibits no edema or tenderness.  Neurological: She is alert and oriented to person, place, and time. She has normal reflexes. No cranial nerve deficit.  Skin: Skin is warm and dry.  Psychiatric: She has a normal mood and affect. Her behavior is normal. Judgment and thought content normal.  Vitals reviewed.  Cervical x-ray- WNL Preliminary reading by Jannifer Rodney, FNP [facility]   BP 112/66 mmHg  Pulse 62  Temp(Src) 97.5 F (36.4 C) (Oral)  Ht  (1.626 m)  Wt 135 lb 12.8 oz (61.598 kg)  BMI 23.30 kg/m2      Assessment & Plan:  1. Cervical radiculitis -Rest -Ice -Flexeril as needed-Sedation precaution discussed -Mobic daily- No other NSAID"s -IF PT does not improve will refer to Ortho - DG Cervical Spine Complete; Future - Ambulatory referral to Physical Therapy - meloxicam (MOBIC) 15 MG tablet; Take 1 tablet (15 mg total) by mouth daily.  Dispense: 30 tablet; Refill: 3 - predniSONE (STERAPRED UNI-PAK 21 TAB) 10 MG (21) TBPK tablet; Take 1 tablet (10 mg total) by mouth daily. As directed x 6 days  Dispense: 21 tablet; Refill: 0  2. Neck pain - DG Cervical Spine Complete; Future - meloxicam (MOBIC) 15 MG tablet; Take 1 tablet (15 mg total) by mouth daily.  Dispense: 30 tablet;  Refill: 3   Jannifer Rodney, FNP

## 2016-01-05 NOTE — Patient Instructions (Signed)

## 2016-01-05 NOTE — Telephone Encounter (Signed)
Made appointment for patient to see [name] this afternoon at 3:40 pm

## 2016-01-10 ENCOUNTER — Telehealth: Payer: Self-pay | Admitting: Family Medicine

## 2016-01-14 ENCOUNTER — Other Ambulatory Visit: Payer: Self-pay | Admitting: Family Medicine

## 2016-01-15 ENCOUNTER — Telehealth: Payer: Self-pay | Admitting: Family

## 2016-01-15 MED ORDER — TRAMADOL HCL 50 MG PO TABS: 50.0000 mg | ORAL_TABLET | Freq: Two times a day (BID) | ORAL | Status: DC | PRN

## 2016-01-15 NOTE — Telephone Encounter (Signed)
Prescription ready for pick up.

## 2016-01-15 NOTE — Telephone Encounter (Signed)
Patient aware rx is ready for pickup. 

## 2016-01-16 ENCOUNTER — Encounter: Payer: Self-pay | Admitting: *Deleted

## 2016-01-26 ENCOUNTER — Ambulatory Visit (INDEPENDENT_AMBULATORY_CARE_PROVIDER_SITE_OTHER): Payer: PRIVATE HEALTH INSURANCE | Admitting: Pediatrics

## 2016-01-26 ENCOUNTER — Encounter: Payer: Self-pay | Admitting: Pediatrics

## 2016-01-26 VITALS — BP 115/68 | HR 60 | Temp 97.1°F | Ht 64.0 in | Wt 134.2 lb

## 2016-01-26 DIAGNOSIS — R6889 Other general symptoms and signs: Secondary | ICD-10-CM

## 2016-01-26 DIAGNOSIS — R05 Cough: Secondary | ICD-10-CM

## 2016-01-26 DIAGNOSIS — H6501 Acute serous otitis media, right ear: Secondary | ICD-10-CM

## 2016-01-26 DIAGNOSIS — R059 Cough, unspecified: Secondary | ICD-10-CM

## 2016-01-26 LAB — POCT INFLUENZA A/B
INFLUENZA A, POC: NEGATIVE
INFLUENZA B, POC: NEGATIVE

## 2016-01-26 MED ORDER — GUAIFENESIN-CODEINE 100-10 MG/5ML PO SOLN: 5.0000 mL | Freq: Every evening | ORAL | Status: DC | PRN

## 2016-01-26 MED ORDER — AZITHROMYCIN 250 MG PO TABS: ORAL_TABLET | ORAL | Status: DC

## 2016-01-26 NOTE — Patient Instructions (Signed)
Flonase  Salt sprays

## 2016-01-26 NOTE — Progress Notes (Signed)
    Subjective:    Patient ID: Secundino GingerJanice R Gent, female    DOB: 1961/02/18, 55 y.o.   MRN: [id number]  CC: Nasal Congestion; Sore Throat; Ear Pain; and Fever   HPI: Secundino GingerJanice R Bells is a 55 y.o. female presenting for Nasal Congestion; Sore Throat; Ear Pain; and Fever  Fever on and off past few days Started getting sick two days ago Congested Hoarse Sore throat Diarrhea worse than usual, has IBS Coughing at night   Depression screen [facility] 2/[facility] 01/26/2016 12/26/2015 08/25/2015 08/08/2015 08/05/2015  Decreased Interest 0 0 0 0 0  Down, Depressed, Hopeless 0 0 0 0 0  PHQ - 2 Score 0 0 0 0 0     Relevant past medical, surgical, family and social history reviewed and updated as indicated. Interim medical history since our last visit reviewed. Allergies and medications reviewed and updated.    ROS: Per HPI unless specifically indicated above  History  Smoking status  . Former Smoker -- 0.50 packs/day  . Types: Cigarettes  . Quit date: 05/02/2012  Smokeless tobacco  . Never Used    Past Medical History Patient Active Problem List   Diagnosis Date Noted  . IBS (irritable bowel syndrome) 04/17/2015  . 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 L TM pearly gray, OP with mild erythema LYMPH: no cervical LAD CV: NRRR, normal S1/S2, no murmur, distal pulses 2+ b/l Resp: CTABL, no wheezes, normal WOB Abd: +BS, soft, NTND.  Ext: No edema, warm Neuro: Alert and oriented, strength equal b/l UE and LE, coordination grossly normal MSK:  normal muscle bulk     Assessment & Plan:    Liborio NixonJanice was seen today for nasal congestion, sore throat, ear pain and fever, likely due to acute URI. Discussed symptomatic care. Flu negative.  Diagnoses and all orders for this visit:  Flu-like symptoms -     POCT Influenza A/B  Cough -     guaiFENesin-codeine 100-10 MG/5ML syrup; Take 5 mLs by mouth at bedtime as needed for cough.  Right acute serous otitis media, recurrence not specified Start antibiotic if not improving ear pain on Monday or if ear pain significantly worsens over the weekend. -     azithromycin (ZITHROMAX) 250 MG tablet; Take 2 the first day and then one each day after.   Follow up plan: Return if symptoms worsen or fail to improve.  Rex Krasarol Vincent, MD [facility] 01/26/2016, 3:26 PM

## 2016-01-29 ENCOUNTER — Telehealth: Payer: Self-pay | Admitting: Pediatrics

## 2016-01-30 ENCOUNTER — Ambulatory Visit: Payer: PRIVATE HEALTH INSURANCE | Admitting: Physical Therapy

## 2016-02-06 ENCOUNTER — Encounter: Payer: Self-pay | Admitting: Physical Therapy

## 2016-02-06 ENCOUNTER — Ambulatory Visit: Payer: PRIVATE HEALTH INSURANCE | Attending: Family | Admitting: Physical Therapy

## 2016-02-06 DIAGNOSIS — M542 Cervicalgia: Secondary | ICD-10-CM | POA: Insufficient documentation

## 2016-02-06 DIAGNOSIS — R293 Abnormal posture: Secondary | ICD-10-CM

## 2016-02-06 NOTE — Patient Instructions (Signed)
Scapular Retraction (Standing)   With arms at sides, pinch shoulder blades together. Repeat 10 times per set. Do 1-3 sets per session. Do 2 sessions per day.  http://orth.exer.us/944     Flexibility: Neck Retraction   Pull head straight back, keeping eyes and jaw level. Hold 3-5 seconds. Repeat _10 times per set. Do 3-5  sessions per day.  http://orth.exer.us/344   Posture - Sitting   Sit upright, head facing forward. Try using a roll to support lower back. Keep shoulders relaxed, and avoid rounded back. Keep hips level with knees. Avoid crossing legs for long periods.   Flexibility: Corner Stretch   Standing in corner or a doorway with hands just above shoulder level.  Lean forward until a comfortable stretch is felt across chest. Hold __30__ seconds. Repeat __3__ times per set.  Do _2___ sessions per day.  http://orth.exer.us/342   Copyright  VHI. All rights reserved.  Solon PalmJulie Shawne Eskelson, PT 02/06/2016 8:49 AM [facility] [address] [place], [place], [address] Phone: [phone]   Fax:  [phone]

## 2016-02-06 NOTE — Therapy (Signed)
[facility] [address] [place], [place], [address] Phone: [phone]   Fax:  [phone]  Physical Therapy Evaluation  Patient Details  Name: Crystal [name] MRN: [id number] Date of Birth: [birth year] Referring Provider: Jannifer Rodneyhristy Hawks FNP  Encounter Date: 02/06/2016      PT End of Session - 02/06/16 0816    Visit Number 1   Number of Visits 12   Date for PT Re-Evaluation 03/19/16   PT Start Time 0817   PT Stop Time 0856   PT Time Calculation (min) 39 min   Activity Tolerance Patient limited by pain   Behavior During Therapy [facility] for tasks assessed/performed      Past Medical History  Diagnosis Date  . IBS (irritable bowel syndrome)   . Post herpetic neuralgia     Past Surgical History  Procedure Laterality Date  . Tonsillectomy    . Eye muscle surgery      There were no vitals filed for this visit.  Visit Diagnosis:  Cervical pain - Plan: PT plan of care cert/re-cert  Abnormal posture - Plan: PT plan of care cert/re-cert      Subjective Assessment - 02/06/16 0814    Subjective Patient began experiencing neck and shoulder pain about 4-5 months ago. She fell into a ceiling fan about 1.5 years ago when she was cleaning and had to get stitches so she thinks that may have something to do with it. Patient has had shingles three times and the first time followed the same pattern as her current  pain.   Pertinent History Shingles x 3   Diagnostic tests xrays - mild degenerative changes   Patient Stated Goals decrease pain   Currently in Pain? Yes   Pain Score 4    Pain Location Neck   Pain Orientation Left   Pain Descriptors / Indicators Burning   Pain Radiating Towards shoulder/upper traps   Pain Onset More than a month ago   Aggravating Factors  prolonged walking   Pain Relieving Factors lying flat   Effect of Pain on Daily Activities painful            [facility] PT Assessment - 02/06/16 0001    Assessment   Medical Diagnosis cervical radiculitis   Referring Provider Jannifer Rodneyhristy Hawks FNP   Onset Date/Surgical Date 10/26/15   Next MD Visit none scheduled   Prior Therapy no   Precautions   Precautions None   Balance Screen   Has the patient fallen in the past 6 months No   Has the patient had a decrease in activity level because of a fear of falling?  No   Is the patient reluctant to leave their home because of a fear of falling?  No   Prior Function   Level of Independence Independent   Posture/Postural Control   Posture Comments forward head, significant rounded shoulders   ROM / Strength   AROM / PROM / Strength AROM   AROM   AROM Assessment Site Cervical   Cervical Flexion full   Cervical Extension full   Cervical - Right Side Bend 15   Cervical - Left Side Bend 22   Cervical - Right Rotation 70  stiff and drops chin   Cervical - Left Rotation 71   Palpation   Spinal mobility C6/7 decreased mobility   Palpation comment tender of L subocciptitals and UT   Special Tests    Special Tests --  negative cervical special tests  [facility] Adult PT Treatment/Exercise - 02/06/16 0001    Modalities   Modalities Traction   Traction   Type of Traction Cervical   Min (lbs) 5   Max (lbs) 15   Hold Time 99   Rest Time 5   Time 15                PT Education - 02/06/16 1103    Education provided Yes   Education Details HEP   Person(s) Educated Patient   Methods Explanation;Demonstration;Handout   Comprehension Verbalized understanding;Returned demonstration          PT Short Term Goals - 02/06/16 1109    PT SHORT TERM GOAL #1   Title I with hep   Time 2   Period Weeks   Status New           PT Long Term Goals - 02/06/16 1109    PT LONG TERM GOAL #1   Title Patient able to turn her head without pain.   Time 6   Period Weeks   Status New   PT LONG TERM GOAL #2   Title Decreased burning pain in neck by 75% with ADLS.   Time 6    Period Weeks   Status New   PT LONG TERM GOAL #3   Title Patient able to verbalize importance of correct posture in preventing further injury and demonstrate in clinic.   Time 6   Period Weeks   Status New               Plan - 02/06/16 1103    Clinical Impression Statement Patient presents with L neck and upper trapezius pain starting 4-5 months ago which is limiting her ROM. She demonstrates very poor posture in sitting and standing which is likely contributing to patient's pain. Mechanical traction was attempted, but patient only tolerated for a short time before burning sensation increased, so patient was removed from the machine.   Pt will benefit from skilled therapeutic intervention in order to improve on the following deficits Decreased range of motion;Pain;Impaired flexibility;Postural dysfunction   Rehab Potential Excellent   PT Frequency 2x / week   PT Duration 6 weeks   PT Treatment/Interventions ADLs/Self Care Home Management;Electrical Stimulation;Moist Heat;Therapeutic exercise;Ultrasound;Traction;Neuromuscular re-education;Patient/family education;Manual techniques;Dry needling;Passive range of motion   PT Next Visit Plan US/STW to L neck and upper trap. Upper back strengthening, chest stretching. Neck stabilization. Modalities for pain.   PT Home Exercise Plan doorway stretch, cerv  and scapular retraction   Consulted and Agree with Plan of Care Patient         Problem List Patient Active Problem List   Diagnosis Date Noted  . IBS (irritable bowel syndrome) 04/17/2015  . Shingles (herpes zoster) polyneuropathy 04/17/2015    Solon Palm PT  02/06/2016, 11:14 AM  [facility] [address] [place], [place], [address] Phone: [phone]   Fax:  [phone]  Name: Crystal [name] MRN: [id number] Date of Birth: November 17, 1961

## 2016-02-07 ENCOUNTER — Other Ambulatory Visit: Payer: Self-pay | Admitting: Family

## 2016-02-07 DIAGNOSIS — M5412 Radiculopathy, cervical region: Secondary | ICD-10-CM

## 2016-02-07 MED ORDER — CYCLOBENZAPRINE HCL 5 MG PO TABS: 5.0000 mg | ORAL_TABLET | Freq: Three times a day (TID) | ORAL | Status: DC | PRN

## 2016-02-07 NOTE — Telephone Encounter (Signed)
Prescription sent to pharmacy.

## 2016-02-07 NOTE — Telephone Encounter (Signed)
Patient aware.

## 2016-02-13 ENCOUNTER — Encounter: Payer: PRIVATE HEALTH INSURANCE | Admitting: Physical Therapy

## 2016-02-29 ENCOUNTER — Ambulatory Visit: Payer: PRIVATE HEALTH INSURANCE | Attending: Family | Admitting: Physical Therapy

## 2016-02-29 ENCOUNTER — Encounter: Payer: Self-pay | Admitting: Physical Therapy

## 2016-02-29 DIAGNOSIS — R293 Abnormal posture: Secondary | ICD-10-CM | POA: Insufficient documentation

## 2016-02-29 DIAGNOSIS — M542 Cervicalgia: Secondary | ICD-10-CM | POA: Diagnosis not present

## 2016-02-29 NOTE — Therapy (Addendum)
[facility] [address] [place], [place], [address] Phone: [phone]   Fax:  [phone]  Physical Therapy Treatment  Patient Details  Name: Crystal [name] MRN: [id number] Date of Birth: 08-23-1961 Referring Provider: Jannifer Rodneyhristy Hawks FNP  Encounter Date: 02/29/2016      PT End of Session - 02/29/16 0723    Visit Number 2   Number of Visits 12   Date for PT Re-Evaluation 04/30/16   PT Start Time 0732   PT Stop Time 0810  2 units secondary to pain with traction at 7 minutes   PT Time Calculation (min) 38 min   Activity Tolerance Patient tolerated treatment well   Behavior During Therapy [facility] for tasks assessed/performed      Past Medical History  Diagnosis Date  . IBS (irritable bowel syndrome)   . Post herpetic neuralgia     Past Surgical History  Procedure Laterality Date  . Tonsillectomy    . Eye muscle surgery      There were no vitals filed for this visit.  Visit Diagnosis:  Cervicalgia - Plan: PT plan of care cert/re-cert  Abnormal posture - Plan: PT plan of care cert/re-cert      Subjective Assessment - 02/29/16 0731    Subjective States that her neck is "so so." Lost papers for HEP and has been sick. Reports only stiffness in her neck today.   Pertinent History Shingles x 3   Diagnostic tests xrays - mild degenerative changes   Patient Stated Goals decrease pain   Currently in Pain? Yes   Pain Score 5    Pain Location Neck   Pain Orientation Left;Right   Pain Descriptors / Indicators Tightness;Other (Comment)  Stiffness   Pain Onset More than a month ago                                   PT Short Term Goals - 02/06/16 1109    PT SHORT TERM GOAL #1   Title I with hep   Time 2   Period Weeks   Status New           PT Long Term Goals - 02/06/16 1109    PT LONG TERM GOAL #1   Title Patient able to turn her head without pain.   Time 6   Period Weeks   Status New   PT  LONG TERM GOAL #2   Title Decreased burning pain in neck by 75% with ADLS.   Time 6   Period Weeks   Status New   PT LONG TERM GOAL #3   Title Patient able to verbalize importance of correct posture in preventing further injury and demonstrate in clinic.   Time 6   Period Weeks   Status New               Plan - 02/29/16 0803    Clinical Impression Statement Patient tolerated today's treatment well with no reports of increased pain during treatment. Presented with B UT tightness palpated upon initiation of treatment, Patient reported tenderness upon palpation to B UT and cervical musculature that was treated. Traction was attempted again at 15# max but after 7 minutes patient requested to discontinue due to L cervical paraspinals/ UT pain. Patient was educated that we could continue with US, manual therapy and electrical stimulation along with exercises during treatment. Patient experienced 3/10 cervical stiffness upon end of treatment.  Rehab Potential Excellent   PT Frequency 2x / week   PT Duration 6 weeks   PT Treatment/Interventions ADLs/Self Care Home Management;Electrical Stimulation;Moist Heat;Therapeutic exercise;Ultrasound;Traction;Neuromuscular re-education;Patient/family education;Manual techniques;Dry needling;Passive range of motion   PT Next Visit Plan US/STW to L neck and upper trap. Upper back strengthening, chest stretching. Neck stabilization. Modalities for pain. D/C traction per reports of pain.   PT Home Exercise Plan doorway stretch, cerv  and scapular retraction   Consulted and Agree with Plan of Care Patient        Problem List Patient Active Problem List   Diagnosis Date Noted  . IBS (irritable bowel syndrome) 04/17/2015  . Shingles (herpes zoster) polyneuropathy 04/17/2015    APPLEGATE, [place], PTA 03/01/2016, 2:03 PM [place] Applegate MPT [facility] [address] [place], [place], [address] Phone:  [phone]   Fax:  [phone]  Name: Crystal [name] MRN: [id number] Date of Birth: 06-Jul-1961

## 2016-03-01 NOTE — Addendum Note (Signed)
Addended by: Jann Milkovich, ItalyHAD W on: 03/01/2016 02:03 PM   Modules accepted: Orders

## 2016-03-07 ENCOUNTER — Encounter: Payer: PRIVATE HEALTH INSURANCE | Admitting: Physical Therapy

## 2016-04-18 ENCOUNTER — Ambulatory Visit: Payer: PRIVATE HEALTH INSURANCE | Attending: Family | Admitting: Physical Therapy

## 2016-04-18 DIAGNOSIS — M542 Cervicalgia: Secondary | ICD-10-CM | POA: Diagnosis not present

## 2016-04-18 DIAGNOSIS — R293 Abnormal posture: Secondary | ICD-10-CM

## 2016-04-18 NOTE — Patient Instructions (Signed)
Thoracic Self-Mobilization (Sitting)  Thoracic Self-Mobilization Stretch (Supine)   With small rolled towel at lower ribs level, gently lie back until stretch is felt. Hold __60__ seconds. Relax. Repeat _2___ times per set. Do ____ sets per session. Do _2___ sessions per day.  http://orth.exer.us/994   Copyright  VHI. All rights reserved.    Strengthening: Chest Pull - Resisted  STAND AGAINST THE WALL   With resistive band looped around each hand, and arms straight out in front, stretch band across chest. Repeat __10__ times per set. Do 1-3____ sets per session. Do _1___ sessions per day.  Also: pull apart and then raise overhead. Same reps.  http://orth.exer.us/926   Copyright  VHI. All rights reserved.   Resistive Band Rowing   With resistive band anchored in door, grasp both ends. Keeping elbows bent, pull back, squeezing shoulder blades together. Hold _3-5___ seconds. Repeat _10-30___ times. Do ____ sessions per day.  Solon PalmJulie Lurleen Soltero, PT 04/18/2016 8:42 AM [facility] [address] [place], [place], [address] Phone: [phone]   Fax:  [phone]

## 2016-04-18 NOTE — Therapy (Addendum)
[facility] [place], [place], [address] Phone: [phone]   Fax:  [phone]  Physical Therapy Treatment  Patient Details  Name: Crystal [name] MRN: [id number] Date of Birth: Jul 19, 1961 Referring Provider: Evelina Dun FNP  Encounter Date: 04/18/2016      PT End of Session - 04/18/16 0815    Visit Number 3   Number of Visits 12   Date for PT Re-Evaluation 04/30/16   PT Start Time 0817   PT Stop Time 0901   PT Time Calculation (min) 44 min   Activity Tolerance Patient tolerated treatment well   Behavior During Therapy [facility] for tasks assessed/performed      Past Medical History  Diagnosis Date  . IBS (irritable bowel syndrome)   . Post herpetic neuralgia     Past Surgical History  Procedure Laterality Date  . Tonsillectomy    . Eye muscle surgery      There were no vitals filed for this visit.      Subjective Assessment - 04/18/16 0818    Subjective Pain is intermittent. Try exercise some but not as much as I should.   Pertinent History Shingles x 3   Diagnostic tests xrays - mild degenerative changes   Patient Stated Goals decrease pain   Currently in Pain? Yes   Pain Score 3    Pain Location Neck   Pain Orientation Right;Left   Pain Descriptors / Indicators Tightness   Pain Radiating Towards shoulder/upper traps   Pain Onset More than a month ago   Aggravating Factors  prolonged                          [facility] Adult PT Treatment/Exercise - 04/18/16 0001    Exercises   Exercises Shoulder   Shoulder Exercises: Supine   Other Supine Exercises thoracic extension over rolled towel x 3 min   Shoulder Exercises: Seated   Elevation Strengthening;Both;10 reps  back against wall; wall angels   Shoulder Exercises: Standing   Horizontal ABduction Strengthening;Both;20 reps;Theraband  Then 2x10 with overhead lift against wall for both   Theraband Level (Shoulder Horizontal ABduction)  Level 2 (Red)   Row Strengthening;Both;10 reps  pink xts   Row Limitations difficult   Other Standing Exercises neck retraction against wall 5 sec hold 1x10   Shoulder Exercises: Stretch   Corner Stretch 2 reps;30 seconds   Modalities   Modalities Ultrasound   Ultrasound   Ultrasound Location B UT   Ultrasound Parameters 1.5w/cm2 1 mhz cont x 8 min   Ultrasound Goals Pain   Manual Therapy   Manual Therapy Soft tissue mobilization   Soft tissue mobilization to B UT                PT Education - 04/18/16 1102    Education provided Yes   Education Details HEP   Person(s) Educated Patient   Methods Explanation;Demonstration;Handout   Comprehension Verbalized understanding;Returned demonstration          PT Short Term Goals - 02/06/16 1109    PT SHORT TERM GOAL #1   Title I with hep   Time 2   Period Weeks   Status New           PT Long Term Goals - 04/18/16 0859    PT LONG TERM GOAL #1   Title Patient able to turn her head without pain.   Baseline 5/10 at end range left rotation  Time 6   Period Weeks   Status On-going   PT LONG TERM GOAL #2   Title Decreased burning pain in neck by 75% with ADLS.   Baseline 1x every day to everyother day. Frequency decreasing but intensity still the same.   Time 6   Period Weeks   Status On-going   PT LONG TERM GOAL #3   Title Patient able to verbalize importance of correct posture in preventing further injury and demonstrate in clinic.   Time 6   Period Weeks   Status On-going               Plan - 04/18/16 1103    Clinical Impression Statement Patient did well with therex today. She has the thoracic and cervical mobility to maintain good posture, but is weak in postural muscles. PT continued to stress importance of posture in preventing pain. Patient reports improvement with frequency of pain, but intensity remains the same.   Rehab Potential Excellent   PT Frequency 2x / week   PT Duration 6 weeks    PT Treatment/Interventions ADLs/Self Care Home Management;Electrical Stimulation;Moist Heat;Therapeutic exercise;Ultrasound;Traction;Neuromuscular re-education;Patient/family education;Manual techniques;Dry needling;Passive range of motion   PT Next Visit Plan Continue postural stengthening, chest stretching and neck stabilization. Modalities for pain.   PT Home Exercise Plan thoracic extension over towel.   Consulted and Agree with Plan of Care Patient      Patient will benefit from skilled therapeutic intervention in order to improve the following deficits and impairments:  Decreased range of motion, Pain, Impaired flexibility, Postural dysfunction  Visit Diagnosis: Cervicalgia  Abnormal posture     Problem List Patient Active Problem List   Diagnosis Date Noted  . IBS (irritable bowel syndrome) 04/17/2015  . Shingles (herpes zoster) polyneuropathy 04/17/2015    Madelyn Flavors PT  04/18/2016, 11:09 AM  [facility] [place], [place], [address] Phone: [phone]   Fax:  [phone]  Name: NEJLA REASOR MRN: [id number] Date of Birth: May 28, 1961   PHYSICAL THERAPY DISCHARGE SUMMARY  Visits from Start of Care: 3.  Current functional level related to goals / functional outcomes: See above.   Remaining deficits: Continued neck pain.   Education / Equipment: HEP. Plan: Patient agrees to discharge.  Patient goals were not met. Patient is being discharged due to not returning since the last visit.  ?????          Mali Applegate MPT

## 2016-04-23 ENCOUNTER — Encounter: Payer: PRIVATE HEALTH INSURANCE | Admitting: *Deleted

## 2016-04-25 ENCOUNTER — Encounter: Payer: PRIVATE HEALTH INSURANCE | Admitting: Physical Therapy

## 2016-05-09 ENCOUNTER — Ambulatory Visit (INDEPENDENT_AMBULATORY_CARE_PROVIDER_SITE_OTHER): Payer: PRIVATE HEALTH INSURANCE | Admitting: Family Medicine

## 2016-05-09 VITALS — BP 106/61 | HR 65 | Temp 97.7°F | Ht 64.0 in | Wt 139.2 lb

## 2016-05-09 DIAGNOSIS — M75102 Unspecified rotator cuff tear or rupture of left shoulder, not specified as traumatic: Secondary | ICD-10-CM | POA: Diagnosis not present

## 2016-05-09 NOTE — Progress Notes (Signed)
   HPI  Patient presents today shoulder pain.  Patient's when she's had left shoulder pain now for about 5-6 days. It steadily getting worse. She's having difficulty sleeping on it at night. She also has difficulty with specific arm movements.  She has no discrete injury.  She does have chronic neck pain and shingles related postherpetic neuralgia of the left neck, this is distinctly different than the pain.  She has had bursitis in the shoulder previously, this is similar to that episode. She did well with an injection of the subacromial bursa that time.  PMH: Smoking status noted ROS: Per HPI  Objective: BP 106/61 mmHg  Pulse 65  Temp(Src) 97.7 F (36.5 C) (Oral)  Ht 5\' 4"  (1.626 m)  Wt 139 lb 3.2 oz (63.141 kg)  BMI 23.88 kg/m2 Gen: NAD, alert, cooperative with exam HEENT: NCAT CV: RRR, good S1/S2, no murmur Resp: CTABL, no wheezes, non-labored Ext: No edema, warm Neuro: Alert and oriented, No gross deficits  MSK Left shoulder with full range of motion and good strength in all directions No tenderness to palpation of any of the bony or muscular landmarks. Positive empty can test, negative Hawkins test   Informed consent discussed,  Area cleaned with iodine x 2 and wiped clear with alcohol swab.  Using 21 1/2 gauge needle 1 cc Kenalog and 3 cc's 1% Lidocaine were injected in subacromial space via posterior approach.  Sterile bandage placed.  Patient tolerated procedure well.  No complications.     Assessment and plan:  # Rotator cuff syndrome  IM Depo-Medrol placed in the subacromial space today, discussed with her conservative therapy, ice, rest, and gentle range of motion exercises Return to clinic as needed, return for any worsening symptoms, failure to improve, or new concerns. Handout given from the sports medicine patient advisor  Murtis SinkSam Yulonda [place]eling, MD [facility] 05/09/2016, 3:51 PM

## 2016-05-09 NOTE — Patient Instructions (Signed)
Great to meet you!  You can shower tonight like usual.   If you have any concerns please let us know, look through the exercises in the handout I gave you

## 2016-05-10 ENCOUNTER — Other Ambulatory Visit: Payer: Self-pay | Admitting: Family

## 2016-05-10 NOTE — Telephone Encounter (Signed)
Last seen 05/09/16  Dr Kathlene NovemberBradshaw  Christy PCP  [facility] approved [facility]

## 2016-05-10 NOTE — Telephone Encounter (Signed)
Patient notified that rx was called into [facility]

## 2016-05-30 ENCOUNTER — Other Ambulatory Visit: Payer: Self-pay | Admitting: *Deleted

## 2016-05-30 DIAGNOSIS — M542 Cervicalgia: Secondary | ICD-10-CM

## 2016-05-30 DIAGNOSIS — M5412 Radiculopathy, cervical region: Secondary | ICD-10-CM

## 2016-05-30 MED ORDER — MELOXICAM 15 MG PO TABS: 15.0000 mg | ORAL_TABLET | Freq: Every day | ORAL | Status: DC

## 2016-06-02 ENCOUNTER — Other Ambulatory Visit: Payer: Self-pay | Admitting: Family

## 2016-06-04 ENCOUNTER — Other Ambulatory Visit: Payer: Self-pay | Admitting: *Deleted

## 2016-06-04 DIAGNOSIS — M542 Cervicalgia: Secondary | ICD-10-CM

## 2016-06-04 DIAGNOSIS — M5412 Radiculopathy, cervical region: Secondary | ICD-10-CM

## 2016-06-04 MED ORDER — MELOXICAM 15 MG PO TABS: 15.0000 mg | ORAL_TABLET | Freq: Every day | ORAL | Status: DC

## 2016-08-07 ENCOUNTER — Ambulatory Visit (INDEPENDENT_AMBULATORY_CARE_PROVIDER_SITE_OTHER): Payer: PRIVATE HEALTH INSURANCE | Admitting: Family Medicine

## 2016-08-07 ENCOUNTER — Encounter: Payer: Self-pay | Admitting: Family Medicine

## 2016-08-07 VITALS — BP 108/66 | HR 66 | Temp 98.0°F | Ht 64.0 in | Wt 139.8 lb

## 2016-08-07 DIAGNOSIS — K589 Irritable bowel syndrome without diarrhea: Secondary | ICD-10-CM

## 2016-08-07 DIAGNOSIS — Z780 Asymptomatic menopausal state: Secondary | ICD-10-CM

## 2016-08-07 DIAGNOSIS — M542 Cervicalgia: Secondary | ICD-10-CM

## 2016-08-07 MED ORDER — VENLAFAXINE HCL ER 37.5 MG PO CP24: ORAL_CAPSULE | ORAL | 0 refills | Status: DC

## 2016-08-07 MED ORDER — TIZANIDINE HCL 2 MG PO CAPS: 2.0000 mg | ORAL_CAPSULE | Freq: Three times a day (TID) | ORAL | 0 refills | Status: DC | PRN

## 2016-08-07 NOTE — Patient Instructions (Signed)
Great to meet you!  We have ordered an MRI and started some new medicines as below  Try meloxicam daily for 3 days Try zanaflex  Effexor is an antidepressant we use for neuropathy and hot flashes, start with 1 capsule daily for one week then increase to 2 capsules once daily.  Come back in 3-4 weeks to follow-up for neck pain.

## 2016-08-07 NOTE — Progress Notes (Signed)
HPI  Patient presents today here with neck pain.  Patient explains that it's been going on for years, however over this last year is been much worse, for the last few weeks it's been even worse.  She describes left-sided neck pain radiating from her left shoulder up to her left occipital scalp, and has a burning type sensation, it hurts with moving her head in certain directions.  She denies any fevers.  She does have menopausal chills that evening on on for about 4 years, she has not had a period in 6 years.  She denies any depression or suicidal thoughts. She has some relief with 600 mg of gabapentin.  She is interested in an MRI, she is also interested in seeing [place] neuro[place] in [place], Dr. Channing Muttersoy  PMH: Smoking status noted ROS: Per HPI  Objective: BP 108/66 (BP Location: Right Arm, Patient Position: Sitting, Cuff Size: Normal)   Pulse 66   Temp 98 F (36.7 C) (Oral)   Ht 5\' 4"  (1.626 m)   Wt 139 lb 12.8 oz (63.4 kg)   BMI 24.00 kg/m  Gen: NAD, alert, cooperative with exam HEENT: NCAT CV: RRR, good S1/S2, no murmur Resp: CTABL, no wheezes, non-labored Abd: SNTND, BS present, no guarding or organomegaly Ext: No edema, warm Neuro: Alert and oriented, No gross deficits  Neck: Full range of motion looking downward or from side to side, limited looking upward to about 60 Tense muscle with spasm on the left neck, no tenderness midline  Strength 5/5 bilateral upper extremities, sensation normal, 2+ biceps and brachioradialis reflexes bilaterally  Assessment and plan:  # Neck pain Chronic, now unresolved over the last 8+ months, this was documented back in January of this year. X-ray shows degenerative changes Flexeril causes too much sedation that does help Meloxicam helps, however it bothers her stomach after a few days Tramadol also causes sedation Gabapentin helps, however pain relief is incomplete.  Starting Effexor today, I hope this will help with the  neuropathic type pain as well as her postmenopausal sweats MRI, likely send to Dr. Channing Muttersoy to consider injection  # Postmenopausal, sweats New problem Starting Effexor Follow-up in 3-4 weeks  IBS At baseline Limits use of NSAIDs as they upset her stomach Short course of NSAIDs necessary currently     Orders Placed This Encounter  Procedures  . MR Cervical Spine Wo Contrast    Standing Status:   Future    Standing Expiration Date:   10/07/2017    Scheduling Instructions:     [place], she is a Materials engineerradiology Tech aide at [employer]    Order Specific Question:   Reason for Exam (SYMPTOM  OR DIAGNOSIS REQUIRED)    Answer:   L sided neck pain    Order Specific Question:   Preferred imaging location?    Answer:   External    Order Specific Question:   What is the patient's sedation requirement?    Answer:   No Sedation    Order Specific Question:   Does the patient have a pacemaker or implanted devices?    Answer:   No    Meds ordered this encounter  Medications  . venlafaxine XR (EFFEXOR XR) 37.5 MG 24 hr capsule    Sig: 1 capsule once daily for 1 week then 2 capsules once daily    Dispense:  60 capsule    Refill:  0  . tizanidine (ZANAFLEX) 2 MG capsule    Sig: Take 1 capsule (2 mg total)  by mouth 3 (three) times daily as needed for muscle spasms.    Dispense:  60 capsule    Refill:  0    Murtis Sink, MD [facility] 08/07/2016, 4:51 PM

## 2016-09-01 ENCOUNTER — Other Ambulatory Visit: Payer: Self-pay | Admitting: Family Medicine

## 2016-09-04 ENCOUNTER — Other Ambulatory Visit: Payer: Self-pay | Admitting: *Deleted

## 2016-09-04 MED ORDER — VENLAFAXINE HCL ER 37.5 MG PO CP24: ORAL_CAPSULE | ORAL | 0 refills | Status: DC

## 2016-09-05 ENCOUNTER — Other Ambulatory Visit: Payer: Self-pay | Admitting: Family Medicine

## 2016-09-09 ENCOUNTER — Encounter: Payer: Self-pay | Admitting: Family Medicine

## 2016-09-09 ENCOUNTER — Ambulatory Visit (INDEPENDENT_AMBULATORY_CARE_PROVIDER_SITE_OTHER): Payer: PRIVATE HEALTH INSURANCE | Admitting: Family Medicine

## 2016-09-09 VITALS — BP 109/58 | HR 62 | Temp 97.2°F | Ht 64.0 in | Wt 139.4 lb

## 2016-09-09 DIAGNOSIS — N951 Menopausal and female climacteric states: Secondary | ICD-10-CM | POA: Insufficient documentation

## 2016-09-09 DIAGNOSIS — K589 Irritable bowel syndrome without diarrhea: Secondary | ICD-10-CM

## 2016-09-09 DIAGNOSIS — M5412 Radiculopathy, cervical region: Secondary | ICD-10-CM | POA: Diagnosis not present

## 2016-09-09 DIAGNOSIS — M19042 Primary osteoarthritis, left hand: Secondary | ICD-10-CM

## 2016-09-09 DIAGNOSIS — M19049 Primary osteoarthritis, unspecified hand: Secondary | ICD-10-CM | POA: Insufficient documentation

## 2016-09-09 MED ORDER — VENLAFAXINE HCL ER 75 MG PO CP24: ORAL_CAPSULE | ORAL | 3 refills | Status: DC

## 2016-09-09 MED ORDER — LIDOCAINE 5 % EX PTCH: 1.0000 | MEDICATED_PATCH | CUTANEOUS | 3 refills | Status: DC

## 2016-09-09 NOTE — Progress Notes (Signed)
   HPI  Patient presents today her for follow-up after starting Effexor.  Patient started Effexor for left-sided neck pain. She states the pain is gotten much better, though still intermittently hurt. Describes left-sided sharp neck pain radiating from her cervical spine down to her left shoulder  She also has left hand arthritis, hurts worse with activity, left second MCP is the mostaffected joint.  She states Effexor several problems including IBS symptoms, hot flashes, and neuralgia. She could notafford the MRI.  Denies SI  PMH: Smoking status noted ROS: Per HPI  Objective: BP (!) 109/58   Pulse 62   Temp 97.2 F (36.2 C) (Oral)   Ht 5\' 4"  (1.626 m)   Wt 139 lb 6.4 oz (63.2 kg)   BMI 23.93 kg/m  Gen: NAD, alert, cooperative with exam HEENT: NCAT CV: RRR, good S1/S2, no murmur Resp: CTABL, no wheezes, non-labored Ext: No edema, warm Neuro: Alert and oriented, 5/5 strength in BL UE, sensation intact BL UE  MSK:  large joint of the left second MCP, some Bouchard's and Heberden nodes scattered around the hands bilaterally Noi TTP of L sided paraspinal muscles  Assessment and plan:  # cervical neuralgia Improved patient is deferring MRI for now due to cost Tramadol is helping. Trial of Lidoderm patch  # irritable bowel syndrome Improving on Effexor  # postmenopausal hot flashes Improving on Effexor  # arthritis of L 2nd /pointer finger Discussed conservative therapy including Aspercreme    Meds ordered this encounter  Medications  . venlafaxine XR (EFFEXOR-XR) 75 MG 24 hr capsule    Sig: 1 capsule once daily for 1 week then 2 capsules once daily    Dispense:  90 capsule    Refill:  3  . lidocaine (LIDODERM) 5 %    Sig: Place 1 patch onto the skin daily. Remove & Discard patch within 12 hours or as directed by MD    Dispense:  30 patch    Refill:  3    Murtis SinkSam Bradie Lacock, MD [facility] 09/09/2016, 3:15 PM

## 2016-09-09 NOTE — Patient Instructions (Signed)
Great to see you!  I am glad you are getting benefit from venlafaxine, The next capsule is 75 mg, so you can take 1 capsule once daily after you get a refill.   Lets follow up in 2 months

## 2016-10-13 ENCOUNTER — Other Ambulatory Visit: Payer: Self-pay | Admitting: Family

## 2016-10-16 ENCOUNTER — Other Ambulatory Visit: Payer: Self-pay | Admitting: Family

## 2016-10-16 DIAGNOSIS — M542 Cervicalgia: Secondary | ICD-10-CM

## 2016-10-16 DIAGNOSIS — M5412 Radiculopathy, cervical region: Secondary | ICD-10-CM

## 2016-10-19 ENCOUNTER — Other Ambulatory Visit: Payer: Self-pay | Admitting: Family

## 2016-10-22 ENCOUNTER — Other Ambulatory Visit: Payer: Self-pay | Admitting: Family Medicine

## 2016-11-19 ENCOUNTER — Other Ambulatory Visit: Payer: Self-pay | Admitting: Family

## 2016-11-19 DIAGNOSIS — M5412 Radiculopathy, cervical region: Secondary | ICD-10-CM

## 2016-11-20 NOTE — Telephone Encounter (Signed)
Can you send it one time?

## 2016-11-20 NOTE — Telephone Encounter (Signed)
Looks like she is currently taking zanaflex. Please advise on refill for flexeril. Patient of Ermalinda MemosBradshaw

## 2016-11-20 NOTE — Telephone Encounter (Signed)
Denied, if she is taking zanaflex, just needs to take that one

## 2016-12-28 ENCOUNTER — Other Ambulatory Visit: Payer: Self-pay | Admitting: Family Medicine

## 2016-12-28 ENCOUNTER — Other Ambulatory Visit: Payer: Self-pay | Admitting: Family

## 2016-12-28 DIAGNOSIS — M5412 Radiculopathy, cervical region: Secondary | ICD-10-CM

## 2017-01-15 ENCOUNTER — Other Ambulatory Visit: Payer: Self-pay | Admitting: Family

## 2017-01-15 DIAGNOSIS — M5412 Radiculopathy, cervical region: Secondary | ICD-10-CM

## 2017-01-15 DIAGNOSIS — M542 Cervicalgia: Secondary | ICD-10-CM

## 2017-02-06 ENCOUNTER — Other Ambulatory Visit: Payer: Self-pay | Admitting: Family

## 2017-02-13 ENCOUNTER — Encounter: Payer: Self-pay | Admitting: Family Medicine

## 2017-02-13 ENCOUNTER — Ambulatory Visit (INDEPENDENT_AMBULATORY_CARE_PROVIDER_SITE_OTHER): Payer: PRIVATE HEALTH INSURANCE

## 2017-02-13 ENCOUNTER — Ambulatory Visit (INDEPENDENT_AMBULATORY_CARE_PROVIDER_SITE_OTHER): Payer: PRIVATE HEALTH INSURANCE | Admitting: Family Medicine

## 2017-02-13 VITALS — BP 115/68 | HR 60 | Temp 97.2°F | Ht 64.0 in | Wt 142.0 lb

## 2017-02-13 DIAGNOSIS — M79645 Pain in left finger(s): Secondary | ICD-10-CM

## 2017-02-13 DIAGNOSIS — M6740 Ganglion, unspecified site: Secondary | ICD-10-CM | POA: Diagnosis not present

## 2017-02-13 DIAGNOSIS — M65331 Trigger finger, right middle finger: Secondary | ICD-10-CM

## 2017-02-13 NOTE — Patient Instructions (Signed)
Great to see you!  Come back in 2-3 months unless you need us sooner.   We will call with x ray results within 1 week  We will work on a referral to hand surgery.

## 2017-02-13 NOTE — Progress Notes (Signed)
   HPI  Patient presents today there is hand pain and a few hand complaints.  Patient also has hip pain which responds well to Tylenol.  Right wrist with what appears to be ganglion cyst for several months. She hit it early on trying to positive burst, however it enlarged after that.  Right third finger has triggering effect at night.  Left fourth finger PIP has had extreme pain with certain positions for about 10 days. No injury. No swelling of the joint, no redness or warmth.  PMH: Smoking status noted ROS: Per HPI  Objective: BP 115/68   Pulse 60   Temp 97.2 F (36.2 C) (Oral)   Ht 5\' 4"  (1.626 m)   Wt 142 lb (64.4 kg)   BMI 24.37 kg/m  Gen: NAD, alert, cooperative with exam HEENT: NCAT CV: RRR, good S1/S2, no murmur Resp: CTABL, no wheezes, non-labored Ext: No edema, warm Neuro: Alert and oriented, No gross deficits  MSK R wrist with aparent ganglion cyst L 4th PIP with tenderness to palp  Assessment and plan:  # finger pain L 4th finger pain at PIP Plain film, No injury Likely OA  Plain film shows mild degenerative changes.   # Trigger finger Refer to hand surgery Mostly night-time symptoms  # Ganglion cyst Refer to hand surgery, present for a while, now symptomatic    Orders Placed This Encounter  Procedures  . DG Hand Complete Left    Standing Status:   Future    Number of Occurrences:   1    Standing Expiration Date:   04/15/2018    Order Specific Question:   Reason for Exam (SYMPTOM  OR DIAGNOSIS REQUIRED)    Answer:   L 4th PIP pain    Order Specific Question:   Is patient pregnant?    Answer:   No    Order Specific Question:   Preferred imaging location?    Answer:   Internal  . Ambulatory referral to Hand Surgery    Referral Priority:   Routine    Referral Type:   Surgical    Referral Reason:   Specialty Services Required    Requested Specialty:   Hand Surgery    Number of Visits Requested:   1     Murtis SinkSam Jeanne Diefendorf, MD [facility] 02/13/2017, 5:16 PM

## 2017-02-14 ENCOUNTER — Other Ambulatory Visit: Payer: Self-pay | Admitting: Family Medicine

## 2017-02-14 MED ORDER — GABAPENTIN 800 MG PO TABS: 800.0000 mg | ORAL_TABLET | Freq: Three times a day (TID) | ORAL | 5 refills | Status: DC

## 2017-02-14 NOTE — Telephone Encounter (Signed)
increase gabapentin with uncontrolled pain.   Murtis SinkSam Bradshaw, MD [facility] 02/14/2017, 10:12 AM

## 2017-03-21 ENCOUNTER — Other Ambulatory Visit: Payer: Self-pay | Admitting: *Deleted

## 2017-03-21 NOTE — Telephone Encounter (Signed)
PT needs to be seen

## 2017-04-15 ENCOUNTER — Telehealth: Payer: Self-pay | Admitting: Family

## 2017-04-15 MED ORDER — TIZANIDINE HCL 2 MG PO CAPS: 2.0000 mg | ORAL_CAPSULE | Freq: Three times a day (TID) | ORAL | 2 refills | Status: DC | PRN

## 2017-04-15 NOTE — Telephone Encounter (Signed)
Patient aware.

## 2017-04-15 NOTE — Telephone Encounter (Signed)
Rx refilled.   Murtis SinkSam Bradshaw, MD [facility] 04/15/2017, 9:28 AM

## 2017-05-22 ENCOUNTER — Telehealth: Payer: Self-pay | Admitting: Family

## 2017-05-22 ENCOUNTER — Encounter: Payer: Self-pay | Admitting: Family Medicine

## 2017-05-22 ENCOUNTER — Ambulatory Visit (INDEPENDENT_AMBULATORY_CARE_PROVIDER_SITE_OTHER): Payer: Commercial Managed Care - PPO | Admitting: Family Medicine

## 2017-05-22 VITALS — BP 114/67 | HR 62 | Temp 97.5°F | Ht 64.0 in | Wt 144.0 lb

## 2017-05-22 DIAGNOSIS — M1811 Unilateral primary osteoarthritis of first carpometacarpal joint, right hand: Secondary | ICD-10-CM

## 2017-05-22 MED ORDER — DICLOFENAC SODIUM 1 % TD GEL: 2.0000 g | Freq: Four times a day (QID) | TRANSDERMAL | 5 refills | Status: DC

## 2017-05-22 MED ORDER — METHYLPREDNISOLONE ACETATE 80 MG/ML IJ SUSP
80.0000 mg | Freq: Once | INTRAMUSCULAR | Status: AC
  Administered 2017-05-22: 80 mg via INTRAMUSCULAR

## 2017-05-22 NOTE — Progress Notes (Signed)
BP 114/67   Pulse 62   Temp 97.5 F (36.4 C) (Oral)   Ht 5\' 4"  (1.626 m)   Wt 144 lb (65.3 kg)   BMI 24.72 kg/m    Subjective:    Patient ID: Crystal [name], female    DOB: July 18, 1961, 56 y.o.   MRN: [id number]  HPI: Crystal [name] is a 56 y.o. female presenting on 05/22/2017 for Right thumb pain (ganglion cyst on right wrist, was larger but since it has shrunk this pain has began; has seen [facility] Amanda [facility]Gramig [facility] and received trigger finger injections)   HPI Right thumb pain, worsening Patient comes in today with complaints of worsening right thumb pain in both of the joints surrounding her right thumb. She knows that she has arthritis there but over the past week it has significantly worsened. She was seen by [place]oped[place] in [place] but her insurance does not cover them well and wants [facility] to [facility]. She wants referral to go [facility] as soon as possible. She denies any fevers or chills or redness or warmth. She does have a ganglion cyst near the base of her right thumb on her wrist but that is actually decreased in size and is not where she is having her pain currently. She has been using Tylenol and ibuprofen which helps some but ibuprofen does irritate her stomach significantly.  Relevant past medical, surgical, family and social history reviewed and updated as indicated. Interim medical history since our last visit reviewed. Allergies and medications reviewed and updated.  Review of Systems  Constitutional: Negative for chills and fever.  Respiratory: Negative for chest tightness and shortness of breath.   Cardiovascular: Negative for chest pain and leg swelling.  Musculoskeletal: Positive for arthralgias. Negative for back pain, gait problem and joint swelling.  Skin: Negative for rash.  Neurological: Negative for light-headedness and headaches.  Psychiatric/Behavioral: Negative for agitation and behavioral problems.    All other systems reviewed and are negative.   Per HPI unless specifically indicated above        Objective:    BP 114/67   Pulse 62   Temp 97.5 F (36.4 C) (Oral)   Ht 5\' 4"  (1.626 m)   Wt 144 lb (65.3 kg)   BMI 24.72 kg/m   Wt Readings from Last 3 Encounters:  05/22/17 144 lb (65.3 kg)  02/13/17 142 lb (64.4 kg)  09/09/16 139 lb 6.4 oz (63.2 kg)    Physical Exam  Constitutional: She is oriented to person, place, and time. She appears well-developed and well-nourished. No distress.  Eyes: Conjunctivae are normal.  Musculoskeletal:       Right hand: She exhibits decreased range of motion and tenderness (Tenderness in both the carpometacarpal and metacarpophalangeal and interphalangeal joint on her right thumb. No erythema or warmth. She does have some loss of range of motion because of the arthritis). She exhibits normal capillary refill, no deformity and no swelling. Normal sensation noted. Normal strength noted.  Neurological: She is alert and oriented to person, place, and time. Coordination normal.  Skin: Skin is warm and dry. No rash noted. She is not diaphoretic.  Psychiatric: She has a normal mood and affect. Her behavior is normal.  Nursing note and vitals reviewed.       Assessment & Plan:   Problem List Items Addressed This Visit    None    Visit Diagnoses  Degenerative arthritis of thumb, right    -  Primary   Relevant Medications   methylPREDNISolone acetate (DEPO-MEDROL) injection 80 mg (Start on 05/22/2017  5:00 PM)   diclofenac sodium (VOLTAREN) 1 % GEL   Other Relevant Orders   Ambulatory referral to Orthopedics       Follow up plan: Return if symptoms worsen or fail to improve.  Counseling provided for all of the vaccine components Orders Placed This Encounter  Procedures  . Ambulatory referral to Orthopedics    Arville Care, MD [facility] 05/22/2017, 4:49 PM

## 2017-05-22 NOTE — Telephone Encounter (Signed)
Pt given appt with Dr Louanne Skyeettinger today since the problem is with her right hand and we had seen her for her left hand.

## 2017-05-22 NOTE — Telephone Encounter (Signed)
What type of referral do you need? Ortho, for her hand  Have you been seen at our office for this problem? yes (If no, schedule them an appointment.  They will need to be seen before a referral can be done.)  Is there a particular doctor or location that you prefer? Dr. Brett CanalesSteve case in [place]. We referred her to [facility] but her insurance does not pay well there.  Patient notified that referrals can take up to a week or longer to process. If they haven't heard anything within a week they should call back and speak with the referral department.

## 2017-07-19 ENCOUNTER — Encounter: Payer: Self-pay | Admitting: Physician Assistant

## 2017-07-19 ENCOUNTER — Ambulatory Visit (INDEPENDENT_AMBULATORY_CARE_PROVIDER_SITE_OTHER): Payer: Commercial Managed Care - PPO | Admitting: Physician Assistant

## 2017-07-19 VITALS — BP 132/76 | HR 60 | Temp 96.7°F | Ht 64.0 in | Wt 146.2 lb

## 2017-07-19 DIAGNOSIS — L739 Follicular disorder, unspecified: Secondary | ICD-10-CM | POA: Diagnosis not present

## 2017-07-19 DIAGNOSIS — R5383 Other fatigue: Secondary | ICD-10-CM

## 2017-07-19 MED ORDER — CEPHALEXIN 500 MG PO CAPS: 500.0000 mg | ORAL_CAPSULE | Freq: Four times a day (QID) | ORAL | 0 refills | Status: DC

## 2017-07-19 MED ORDER — FLUCONAZOLE 150 MG PO TABS: ORAL_TABLET | ORAL | 0 refills | Status: DC

## 2017-07-19 NOTE — Progress Notes (Signed)
BP 132/76 (BP Location: Left Arm, Patient Position: Sitting, Cuff Size: Normal)   Pulse 60   Temp (!) 96.7 F (35.9 C) (Oral)   Ht 5\' 4"  (1.626 m)   Wt 146 lb 3.2 oz (66.3 kg)   BMI 25.10 kg/m    Subjective:    Patient ID: Crystal [name], female    DOB: Dec 21, 1960, 56 y.o.   MRN: [id number]  HPI: Crystal [name] is a 56 y.o. female presenting on 07/19/2017 for Abscess (back of head, headache, fatigue, worsening) and Otalgia (bilateral, L>R, fullness x 1 wk)  This patient comes in for areas on the back of his scalp. They have been quite tender at times. She has never had a problem with seborrheic keratosis. They will have pus bumps. She has tried medicated shampoos without much improvement. She denies any fever or chills.  Relevant past medical, surgical, family and social history reviewed and updated as indicated. Allergies and medications reviewed and updated.  Past Medical History:  Diagnosis Date  . IBS (irritable bowel syndrome)   . Post herpetic neuralgia     Past Surgical History:  Procedure Laterality Date  . EYE MUSCLE SURGERY    . TONSILLECTOMY      Review of Systems  Constitutional: Negative.  Negative for chills, fatigue and fever.  HENT: Positive for congestion.   Eyes: Negative.   Respiratory: Negative.   Cardiovascular: Negative.   Gastrointestinal: Negative.   Genitourinary: Negative.   Skin: Positive for wound.    Allergies as of 07/19/2017      Reactions   Reglan [metoclopramide] Other (See Comments)   Muscle spasms   Sulfa Antibiotics Anaphylaxis      Medication List       Accurate as of 07/19/17  9:40 AM. Always use your most recent med list.          CELEBREX 100 MG capsule Generic drug:  celecoxib Take 100 mg by mouth 2 (two) times daily.   cephALEXin 500 MG capsule Commonly known as:  KEFLEX Take 1 capsule (500 mg total) by mouth 4 (four) times daily.   diclofenac sodium 1 % Gel Commonly known as:  VOLTAREN Apply 2 g topically 4  (four) times daily.   FLONASE NA Place into the nose.   fluconazole 150 MG tablet Commonly known as:  DIFLUCAN 1 po q week x 4 weeks   gabapentin 800 MG tablet Commonly known as:  NEURONTIN Take 1 tablet (800 mg total) by mouth 3 (three) times daily.   lidocaine 5 % Commonly known as:  LIDODERM Place 1 patch onto the skin daily. Remove & Discard patch within 12 hours or as directed by MD   loperamide 2 MG capsule Commonly known as:  IMODIUM Take by mouth as needed for diarrhea or loose stools.   tizanidine 2 MG capsule Commonly known as:  ZANAFLEX Take 1 capsule (2 mg total) by mouth 3 (three) times daily as needed for muscle spasms.   venlafaxine XR 75 MG 24 hr capsule Commonly known as:  EFFEXOR-XR 1 capsule once daily            Discharge Care Instructions        Start     Ordered   07/19/17 0000  cephALEXin (KEFLEX) 500 MG capsule  4 times daily    Question:  Supervising Provider  Answer:  Elenora Gamma   07/19/17 0934   07/19/17 0000  fluconazole (DIFLUCAN) 150 MG tablet    Question:  Supervising Provider  Answer:  Elenora Gamma   07/19/17 0934         Objective:    BP 132/76 (BP Location: Left Arm, Patient Position: Sitting, Cuff Size: Normal)   Pulse 60   Temp (!) 96.7 F (35.9 C) (Oral)   Ht 5\' 4"  (1.626 m)   Wt 146 lb 3.2 oz (66.3 kg)   BMI 25.10 kg/m   Allergies  Allergen Reactions  . Reglan [Metoclopramide] Other (See Comments)    Muscle spasms  . Sulfa Antibiotics Anaphylaxis    Physical Exam  Constitutional: She is oriented to person, place, and time. She appears well-developed and well-nourished.  HENT:  Head: Normocephalic and atraumatic.    Folliculitis lesions in the occipital area and hairline.  Eyes: Pupils are equal, round, and reactive to light. Conjunctivae and EOM are normal.  Cardiovascular: Normal rate, regular rhythm, normal heart sounds and intact distal pulses.   Pulmonary/Chest: Effort normal and breath  sounds normal.  Abdominal: Soft. Bowel sounds are normal.  Neurological: She is alert and oriented to person, place, and time. She has normal reflexes.  Skin: Skin is warm and dry. No rash noted.  Psychiatric: She has a normal mood and affect. Her behavior is normal. Judgment and thought content normal.  Nursing note and vitals reviewed.       Assessment & Plan:   1. Folliculitis - cephALEXin (KEFLEX) 500 MG capsule; Take 1 capsule (500 mg total) by mouth 4 (four) times daily.  Dispense: 40 capsule; Refill: 0 - fluconazole (DIFLUCAN) 150 MG tablet; 1 po q week x 4 weeks  Dispense: 4 tablet; Refill: 0    Current Outpatient Prescriptions:  .  celecoxib (CELEBREX) 100 MG capsule, Take 100 mg by mouth 2 (two) times daily., Disp: , Rfl:  .  diclofenac sodium (VOLTAREN) 1 % GEL, Apply 2 g topically 4 (four) times daily., Disp: 100 g, Rfl: 5 .  Fluticasone Propionate (FLONASE NA), Place into the nose., Disp: , Rfl:  .  gabapentin (NEURONTIN) 800 MG tablet, Take 1 tablet (800 mg total) by mouth 3 (three) times daily., Disp: 90 tablet, Rfl: 5 .  lidocaine (LIDODERM) 5 %, Place 1 patch onto the skin daily. Remove & Discard patch within 12 hours or as directed by MD, Disp: 30 patch, Rfl: 3 .  loperamide (IMODIUM) 2 MG capsule, Take by mouth as needed for diarrhea or loose stools., Disp: , Rfl:  .  tizanidine (ZANAFLEX) 2 MG capsule, Take 1 capsule (2 mg total) by mouth 3 (three) times daily as needed for muscle spasms., Disp: 90 capsule, Rfl: 2 .  venlafaxine XR (EFFEXOR-XR) 75 MG 24 hr capsule, 1 capsule once daily, Disp: 90 capsule, Rfl: 3 .  cephALEXin (KEFLEX) 500 MG capsule, Take 1 capsule (500 mg total) by mouth 4 (four) times daily., Disp: 40 capsule, Rfl: 0 .  fluconazole (DIFLUCAN) 150 MG tablet, 1 po q week x 4 weeks, Disp: 4 tablet, Rfl: 0 Continue all other maintenance medications as listed above.  Follow up plan: Return if symptoms worsen or fail to improve.  Educational handout  given for survey  Remus Loffler PA-C [facility] [address]  [place], [place] [address] [phone]   07/19/2017, 9:40 AM

## 2017-07-19 NOTE — Patient Instructions (Signed)
In a few days you may receive a survey in the mail or online from [employer] regarding your visit with us today. Please take a moment to fill this out. Your feedback is very important to our whole office. It can help us better understand your needs as well as improve your experience and satisfaction. Thank you for taking your time to complete it. We care about you.  Clorinda Wyble, PA-C  

## 2017-08-20 ENCOUNTER — Other Ambulatory Visit: Payer: Self-pay | Admitting: Family Medicine

## 2017-09-24 ENCOUNTER — Other Ambulatory Visit: Payer: Self-pay | Admitting: Family Medicine

## 2017-10-31 ENCOUNTER — Other Ambulatory Visit: Payer: Self-pay | Admitting: Physician Assistant

## 2017-10-31 ENCOUNTER — Other Ambulatory Visit: Payer: Self-pay | Admitting: Family

## 2017-12-02 ENCOUNTER — Other Ambulatory Visit: Payer: Self-pay | Admitting: Family

## 2017-12-08 ENCOUNTER — Other Ambulatory Visit: Payer: Self-pay | Admitting: Physician Assistant

## 2017-12-31 ENCOUNTER — Other Ambulatory Visit: Payer: Self-pay | Admitting: Physician Assistant

## 2018-01-01 NOTE — Telephone Encounter (Signed)
Last seen 07/19/17

## 2018-01-08 ENCOUNTER — Telehealth: Payer: Self-pay | Admitting: Family

## 2018-01-08 ENCOUNTER — Encounter: Payer: Self-pay | Admitting: *Deleted

## 2018-01-08 DIAGNOSIS — Z780 Asymptomatic menopausal state: Secondary | ICD-10-CM

## 2018-01-08 NOTE — Telephone Encounter (Signed)
Aware. Referral done. 

## 2018-01-19 ENCOUNTER — Other Ambulatory Visit: Payer: Self-pay | Admitting: Family

## 2018-02-02 ENCOUNTER — Other Ambulatory Visit: Payer: Self-pay | Admitting: Family

## 2018-02-25 ENCOUNTER — Other Ambulatory Visit: Payer: Self-pay | Admitting: *Deleted

## 2018-02-26 MED ORDER — GABAPENTIN 800 MG PO TABS: 800.0000 mg | ORAL_TABLET | Freq: Three times a day (TID) | ORAL | 0 refills | Status: DC

## 2018-03-02 ENCOUNTER — Other Ambulatory Visit: Payer: Self-pay | Admitting: Family

## 2018-04-02 ENCOUNTER — Other Ambulatory Visit: Payer: Self-pay | Admitting: Family

## 2018-04-17 ENCOUNTER — Telehealth: Payer: Self-pay | Admitting: Family

## 2018-04-21 NOTE — Telephone Encounter (Signed)
This is something we could discuss on her next visit, but if she is not having symptoms such as arthritis, rash, photosensitivity, anemia, or neurology problems it may not be necessary.

## 2018-04-21 NOTE — Telephone Encounter (Signed)
Patient aware.

## 2018-04-27 ENCOUNTER — Telehealth: Payer: Self-pay

## 2018-04-27 NOTE — Telephone Encounter (Signed)
A dexascan was done on patient and diagnosis given was not appropriate for billing

## 2018-04-29 NOTE — Telephone Encounter (Signed)
[facility]

## 2018-04-29 NOTE — Telephone Encounter (Signed)
LMTCB - can not find in chart where this dexascan was ordered by our office.

## 2018-05-05 ENCOUNTER — Other Ambulatory Visit: Payer: Self-pay | Admitting: Family

## 2018-05-06 NOTE — Telephone Encounter (Signed)
No call back - this encounter will be closed.  

## 2018-05-20 ENCOUNTER — Other Ambulatory Visit: Payer: Self-pay | Admitting: Family

## 2018-05-22 ENCOUNTER — Encounter: Payer: Self-pay | Admitting: Family Medicine

## 2018-05-22 ENCOUNTER — Ambulatory Visit: Payer: Commercial Managed Care - PPO | Admitting: Family Medicine

## 2018-05-22 VITALS — BP 135/69 | HR 68 | Temp 97.2°F | Ht 64.0 in | Wt 144.0 lb

## 2018-05-22 DIAGNOSIS — M2560 Stiffness of unspecified joint, not elsewhere classified: Secondary | ICD-10-CM

## 2018-05-22 DIAGNOSIS — M1991 Primary osteoarthritis, unspecified site: Secondary | ICD-10-CM | POA: Diagnosis not present

## 2018-05-22 DIAGNOSIS — N951 Menopausal and female climacteric states: Secondary | ICD-10-CM

## 2018-05-22 MED ORDER — GABAPENTIN 800 MG PO TABS: 800.0000 mg | ORAL_TABLET | Freq: Three times a day (TID) | ORAL | 1 refills | Status: DC

## 2018-05-22 MED ORDER — TIZANIDINE HCL 2 MG PO CAPS: 2.0000 mg | ORAL_CAPSULE | Freq: Three times a day (TID) | ORAL | 2 refills | Status: DC | PRN

## 2018-05-22 MED ORDER — VENLAFAXINE HCL ER 150 MG PO CP24: 75.0000 mg | ORAL_CAPSULE | Freq: Every day | ORAL | 3 refills | Status: DC

## 2018-05-22 MED ORDER — MELOXICAM 7.5 MG PO TABS: 7.5000 mg | ORAL_TABLET | Freq: Every day | ORAL | 4 refills | Status: DC

## 2018-05-22 NOTE — Progress Notes (Signed)
   HPI  Patient presents today follow-up chronic medical conditions.  Arthritis Patient complains of joint pains in multiple places including neck, bilateral hips, bilateral hands She states that she was placed on Celebrex by hand surgery and did not do well with it.  She had an upset stomach and states it was not very effective. Also her father was found to have lupus when he died recently.  She wonders if there is a test she could have for that.  Her son has Graves' disease. She has significant joint stiffness of at least 1 hour in bilateral hands every morning.  Hot flashes Helped by Effexor, however the effect is waning.  She wonders if we can go up on the medication.  PMH: Smoking status noted ROS: Per HPI  Objective: BP 135/69   Pulse 68   Temp (!) 97.2 F (36.2 C) (Oral)   Ht '5\' 4"'$  (1.626 m)   Wt 144 lb (65.3 kg)   BMI 24.72 kg/m  Gen: NAD, alert, cooperative with exam HEENT: NCAT CV: RRR, good S1/S2, no murmur Resp: CTABL, no wheezes, non-labored Abd: SNTND, BS present, no guarding or organomegaly Ext: No edema, warm Neuro: Alert and oriented, No gross deficits  Assessment and plan:  #Osteoarthritis Multiple joints Trial of meloxicam Labs  # Joint stiffness With family Hx of lupus and graves disease Labs to rule out inflammatory artrhitis.   # Hot flashes Helped by effexor, titrate to 150 daily.    Orders Placed This Encounter  Procedures  . CMP14+EGFR  . CBC with Differential/Platelet  . Lipid panel  . TSH  . ANA,IFA RA Diag Pnl w/rflx Tit/Patn  . Sedimentation Rate    Meds ordered this encounter  Medications  . gabapentin (NEURONTIN) 800 MG tablet    Sig: Take 1 tablet (800 mg total) by mouth 3 (three) times daily.    Dispense:  90 tablet    Refill:  1  . tizanidine (ZANAFLEX) 2 MG capsule    Sig: Take 1 capsule (2 mg total) by mouth 3 (three) times daily as needed for muscle spasms.    Dispense:  90 capsule    Refill:  2  . venlafaxine  XR (EFFEXOR-XR) 150 MG 24 hr capsule    Sig: Take 1 capsule (150 mg total) by mouth daily.    Dispense:  90 capsule    Refill:  3  . meloxicam (MOBIC) 7.5 MG tablet    Sig: Take 1 tablet (7.5 mg total) by mouth daily.    Dispense:  30 tablet    Refill:  [place], MD [facility] 05/22/2018, 4:15 PM

## 2018-05-22 NOTE — Patient Instructions (Signed)
Great to see you!  Come back to see Crystal RodneyChristy Hawks in 4-6 months

## 2018-05-25 LAB — CMP14+EGFR
ALBUMIN: 4.4 g/dL (ref 3.5–5.5)
ALT: 12 IU/L (ref 0–32)
AST: 17 IU/L (ref 0–40)
Albumin/Globulin Ratio: 2 (ref 1.2–2.2)
Alkaline Phosphatase: 98 IU/L (ref 39–117)
BILIRUBIN TOTAL: 0.2 mg/dL (ref 0.0–1.2)
BUN / CREAT RATIO: 16 (ref 9–23)
BUN: 10 mg/dL (ref 6–24)
CALCIUM: 9.6 mg/dL (ref 8.7–10.2)
CO2: 25 mmol/L (ref 20–29)
Chloride: 105 mmol/L (ref 96–106)
Creatinine, Ser: 0.63 mg/dL (ref 0.57–1.00)
GFR, EST AFRICAN AMERICAN: 116 mL/min/{1.73_m2} (ref >59)
GFR, EST NON AFRICAN AMERICAN: 101 mL/min/{1.73_m2} (ref >59)
GLUCOSE: 87 mg/dL (ref 65–99)
Globulin, Total: 2.2 g/dL (ref 1.5–4.5)
Potassium: 4.1 mmol/L (ref 3.5–5.2)
Sodium: 142 mmol/L (ref 134–144)
TOTAL PROTEIN: 6.6 g/dL (ref 6.0–8.5)

## 2018-05-25 LAB — ANA,IFA RA DIAG PNL W/RFLX TIT/PATN
ANA TITER 1: NEGATIVE
Cyclic Citrullin Peptide Ab: 4 units (ref 0–19)

## 2018-05-25 LAB — CBC WITH DIFFERENTIAL/PLATELET
Basophils Absolute: 0.1 10*3/uL (ref 0.0–0.2)
Basos: 1 %
EOS (ABSOLUTE): 0.2 10*3/uL (ref 0.0–0.4)
EOS: 2 %
HEMATOCRIT: 37.7 % (ref 34.0–46.6)
HEMOGLOBIN: 12.9 g/dL (ref 11.1–15.9)
IMMATURE GRANS (ABS): 0 10*3/uL (ref 0.0–0.1)
Immature Granulocytes: 0 %
LYMPHS ABS: 3.6 10*3/uL — AB (ref 0.7–3.1)
LYMPHS: 45 %
MCH: 29 pg (ref 26.6–33.0)
MCHC: 34.2 g/dL (ref 31.5–35.7)
MCV: 85 fL (ref 79–97)
MONOCYTES: 7 %
Monocytes Absolute: 0.5 10*3/uL (ref 0.1–0.9)
NEUTROS ABS: 3.5 10*3/uL (ref 1.4–7.0)
Neutrophils: 45 %
Platelets: 258 10*3/uL (ref 150–450)
RBC: 4.45 x10E6/uL (ref 3.77–5.28)
RDW: 14 % (ref 12.3–15.4)
WBC: 7.8 10*3/uL (ref 3.4–10.8)

## 2018-05-25 LAB — LIPID PANEL
CHOL/HDL RATIO: 4.4 ratio (ref 0.0–4.4)
CHOLESTEROL TOTAL: 203 mg/dL — AB (ref 100–199)
HDL: 46 mg/dL (ref >39)
LDL CALC: 130 mg/dL — AB (ref 0–99)
Triglycerides: 136 mg/dL (ref 0–149)
VLDL CHOLESTEROL CAL: 27 mg/dL (ref 5–40)

## 2018-05-25 LAB — TSH: TSH: 1.61 u[IU]/mL (ref 0.450–4.500)

## 2018-05-25 LAB — SEDIMENTATION RATE: Sed Rate: 3 mm/hr (ref 0–40)

## 2018-06-29 ENCOUNTER — Telehealth: Payer: Self-pay

## 2018-06-29 NOTE — Telephone Encounter (Signed)
[facility] calling and stating her insurance will not pay for DEXA. Can I change the code to screening for osteoporis vs post menopausal, which is what we used?

## 2018-06-29 NOTE — Telephone Encounter (Signed)
Yes, thanks

## 2018-07-24 ENCOUNTER — Other Ambulatory Visit: Payer: Self-pay | Admitting: Family

## 2018-07-28 MED ORDER — LIDOCAINE 5 % EX PTCH: 1.0000 | MEDICATED_PATCH | CUTANEOUS | 3 refills | Status: DC

## 2018-12-23 ENCOUNTER — Ambulatory Visit: Payer: Commercial Managed Care - PPO | Admitting: Family Medicine

## 2018-12-23 ENCOUNTER — Encounter: Payer: Self-pay | Admitting: Family Medicine

## 2018-12-23 VITALS — BP 118/63 | HR 56 | Temp 96.7°F | Ht 64.0 in | Wt 145.2 lb

## 2018-12-23 DIAGNOSIS — B0223 Postherpetic polyneuropathy: Secondary | ICD-10-CM | POA: Diagnosis not present

## 2018-12-23 DIAGNOSIS — M1991 Primary osteoarthritis, unspecified site: Secondary | ICD-10-CM

## 2018-12-23 DIAGNOSIS — N951 Menopausal and female climacteric states: Secondary | ICD-10-CM | POA: Diagnosis not present

## 2018-12-23 DIAGNOSIS — B372 Candidiasis of skin and nail: Secondary | ICD-10-CM

## 2018-12-23 DIAGNOSIS — M5412 Radiculopathy, cervical region: Secondary | ICD-10-CM

## 2018-12-23 DIAGNOSIS — M19042 Primary osteoarthritis, left hand: Secondary | ICD-10-CM

## 2018-12-23 MED ORDER — LIDOCAINE 5 % EX PTCH: 1.0000 | MEDICATED_PATCH | CUTANEOUS | 3 refills | Status: DC

## 2018-12-23 MED ORDER — TIZANIDINE HCL 2 MG PO CAPS: 2.0000 mg | ORAL_CAPSULE | Freq: Three times a day (TID) | ORAL | 2 refills | Status: DC | PRN

## 2018-12-23 MED ORDER — GABAPENTIN 800 MG PO TABS: 800.0000 mg | ORAL_TABLET | Freq: Three times a day (TID) | ORAL | 1 refills | Status: DC

## 2018-12-23 MED ORDER — VENLAFAXINE HCL ER 150 MG PO CP24: 150.0000 mg | ORAL_CAPSULE | Freq: Every day | ORAL | 3 refills | Status: DC

## 2018-12-28 ENCOUNTER — Encounter: Payer: Self-pay | Admitting: Family Medicine

## 2018-12-28 NOTE — Progress Notes (Signed)
Subjective:  Patient ID: Crystal [name], female    DOB: 1961-05-10  Age: 58 y.o. MRN: [id number]  CC: Medical Management of Chronic Issues   HPI Crystal [name] presents for follow-up today on her postherpetic neuralgia.  This affects the left posterior neck into the superior trapezius border and toward the rhomboideus and scapular region.  This can flare to 8/10 intermittently for pain.  Meloxicam just does not seem to be holding her anymore.   She would like to have something stronger than meloxicam.  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  This is been treated also with the meloxicam.  It is moderately severe at times.  It stiffens up in the mornings.  Additionally she has had a history of hot flashes from her menopause.  This is been well controlled with Effexor for her.  She would like to continue that medicine as is.  She requests Lidoderm patches since salon pas helped some.  Patient also has a rash on the inner thighs proximally.  She says it is like 1000 bee stings and it is red.  There is no vaginal discharge.  Depression screen [facility] 2/9 05/22/2018 07/19/2017 05/22/2017  Decreased Interest 0 0 0  Down, Depressed, Hopeless 0 0 0  PHQ - 2 Score 0 0 0  Altered sleeping - - -  Tired, decreased energy - - -  Change in appetite - - -  Feeling bad or failure about yourself  - - -  Trouble concentrating - - -  Moving slowly or fidgety/restless - - -  Suicidal thoughts - - -  PHQ-9 Score - - -    History Crystal [name] has a past medical history of IBS (irritable bowel syndrome) and Post herpetic neuralgia.   She has a past surgical history that includes Tonsillectomy and Eye muscle surgery.   Her family history includes Cancer (age of onset: 62) in her mother; Hypertension in her father.She reports that she quit smoking about 6 years ago. Her smoking use included cigarettes. She smoked 0.50 packs per day. She has never used smokeless tobacco. She reports current alcohol  use. She reports that she does not use drugs.    ROS Review of Systems  Constitutional: Negative.   HENT: Negative for congestion.   Eyes: Negative for visual disturbance.  Respiratory: Negative for shortness of breath.   Cardiovascular: Negative for chest pain.  Gastrointestinal: Negative for abdominal pain, constipation, diarrhea, nausea and vomiting.  Genitourinary: Negative for difficulty urinating.  Musculoskeletal: Positive for arthralgias, myalgias and neck pain.  Neurological: Negative for headaches.  Psychiatric/Behavioral: Negative for sleep disturbance.    Objective:  BP 118/63   Pulse (!) 56   Temp (!) 96.7 F (35.9 C) (Oral)   Ht 5\' 4"  (1.626 m)   Wt 145 lb 3.2 oz (65.9 kg)   BMI 24.92 kg/m   BP Readings from Last 3 Encounters:  12/23/18 118/63  05/22/18 135/69  07/19/17 132/76    Wt Readings from Last 3 Encounters:  12/23/18 145 lb 3.2 oz (65.9 kg)  05/22/18 144 lb (65.3 kg)  07/19/17 146 lb 3.2 oz (66.3 kg)     Physical Exam Skin:    General: Skin is warm and dry.     Findings: Erythema (Noted on the proximal inner thighs bilaterally about 6 inches in length and width) present.       Assessment & Plan:   Jodeci was seen today for medical management of chronic issues.  Diagnoses  and all orders for this visit:  Cervical neuralgia  Shingles (herpes zoster) polyneuropathy  Primary osteoarthritis, unspecified site  Hot flashes due to menopause  Osteoarthritis of finger of left hand  Yeast dermatitis  Other orders -     venlafaxine XR (EFFEXOR-XR) 150 MG 24 hr capsule; Take 1 capsule (150 mg total) by mouth daily. -     tizanidine (ZANAFLEX) 2 MG capsule; Take 1 capsule (2 mg total) by mouth 3 (three) times daily as needed for muscle spasms. -     gabapentin (NEURONTIN) 800 MG tablet; Take 1 tablet (800 mg total) by mouth 3 (three) times daily. -     lidocaine (LIDODERM) 5 %; Place 1 patch onto the skin daily. Remove & Discard patch  within 12 hours or as directed by MD       I have discontinued Crystal [name]. Crystal [name]'s meloxicam. I am also having her maintain her venlafaxine XR, tizanidine, gabapentin, and lidocaine.  Allergies as of 12/23/2018      Reactions   Reglan [metoclopramide] Other (See Comments)   Muscle spasms   Sulfa Antibiotics Anaphylaxis      Medication List       Accurate as of December 23, 2018 11:59 PM. Always use your most recent med list.        gabapentin 800 MG tablet Commonly known as:  NEURONTIN Take 1 tablet (800 mg total) by mouth 3 (three) times daily.   lidocaine 5 % Commonly known as:  LIDODERM Place 1 patch onto the skin daily. Remove & Discard patch within 12 hours or as directed by MD   tizanidine 2 MG capsule Commonly known as:  ZANAFLEX Take 1 capsule (2 mg total) by mouth 3 (three) times daily as needed for muscle spasms.   venlafaxine XR 150 MG 24 hr capsule Commonly known as:  EFFEXOR-XR Take 1 capsule (150 mg total) by mouth daily.        Follow-up: No follow-ups on file.  Mechele Claude, M.D.

## 2019-01-31 IMAGING — DX DG HAND COMPLETE 3+V*L*
3 series · 3 of 3 positions shown · non-contrast
Comparison: None.

CLINICAL DATA: Left hand pain, no known injury, initial encounter

EXAM:
LEFT HAND - COMPLETE 3+ VIEW

[hand pa]
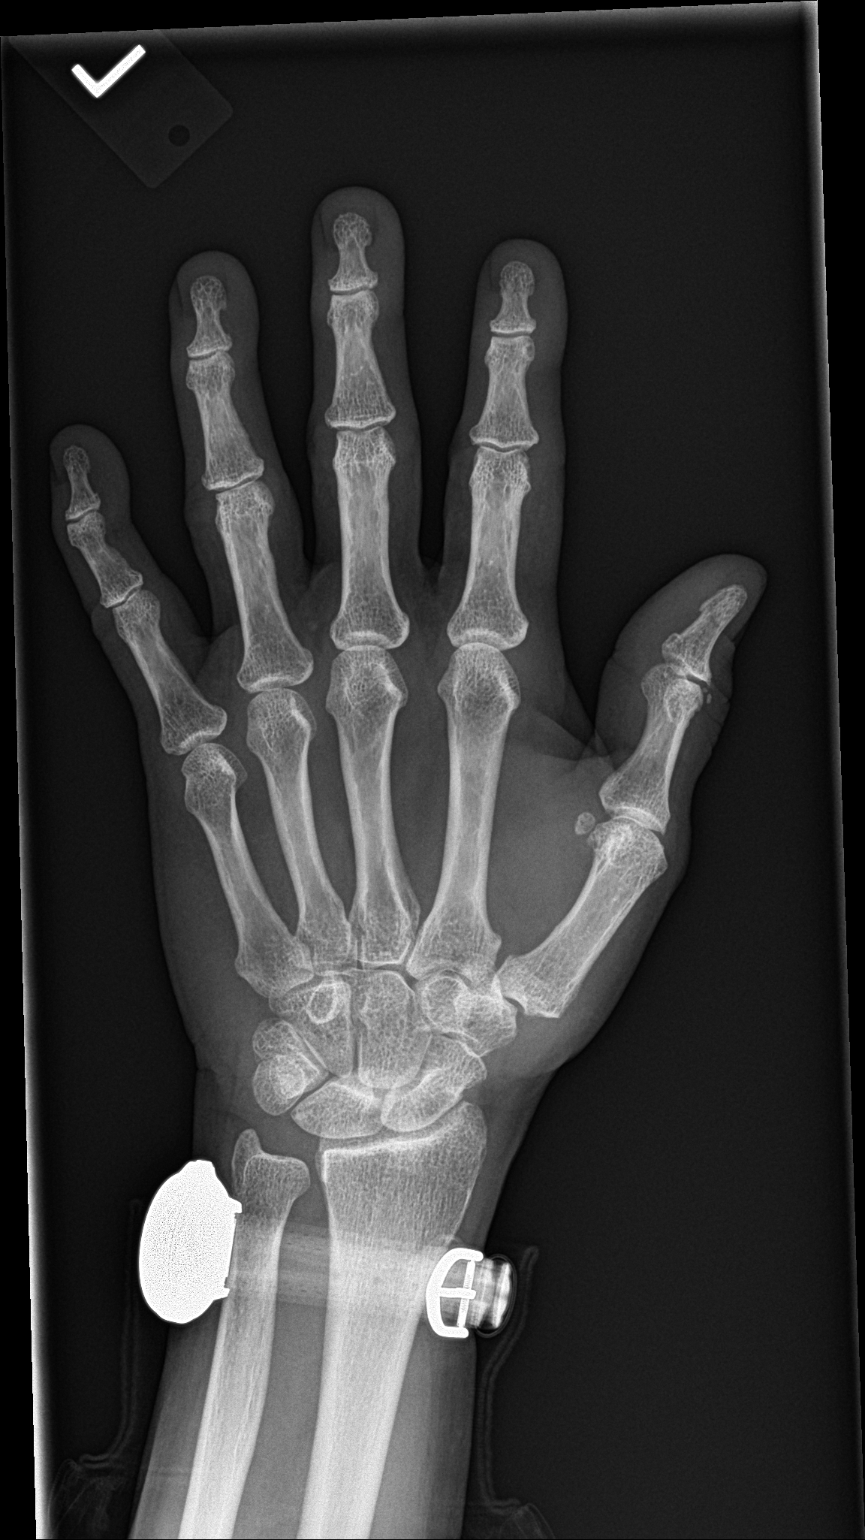

[hand obl]
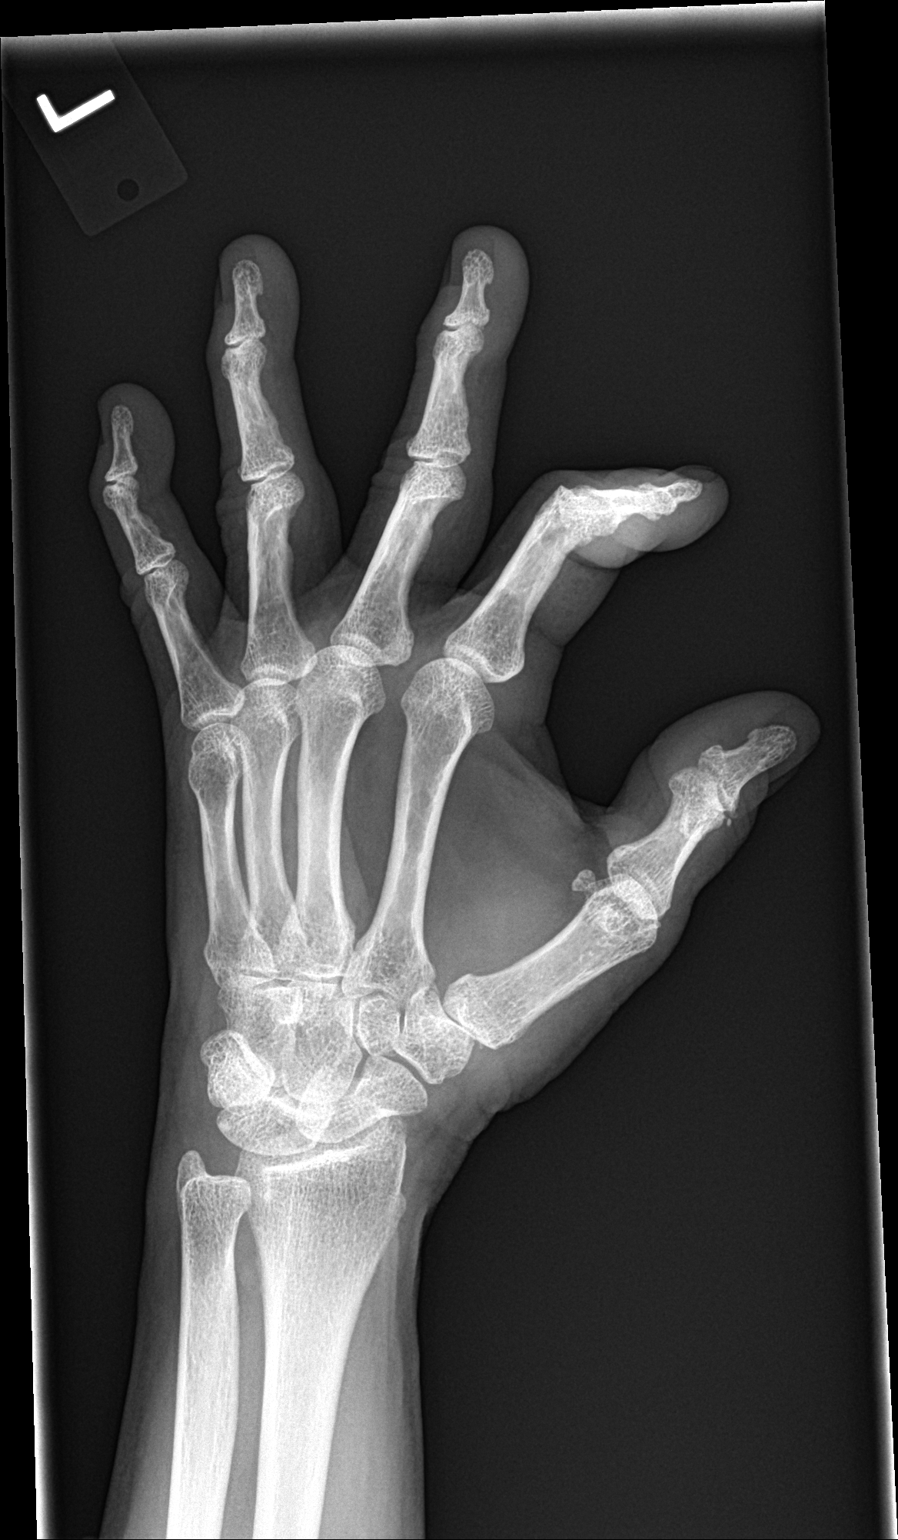

[hand lat]
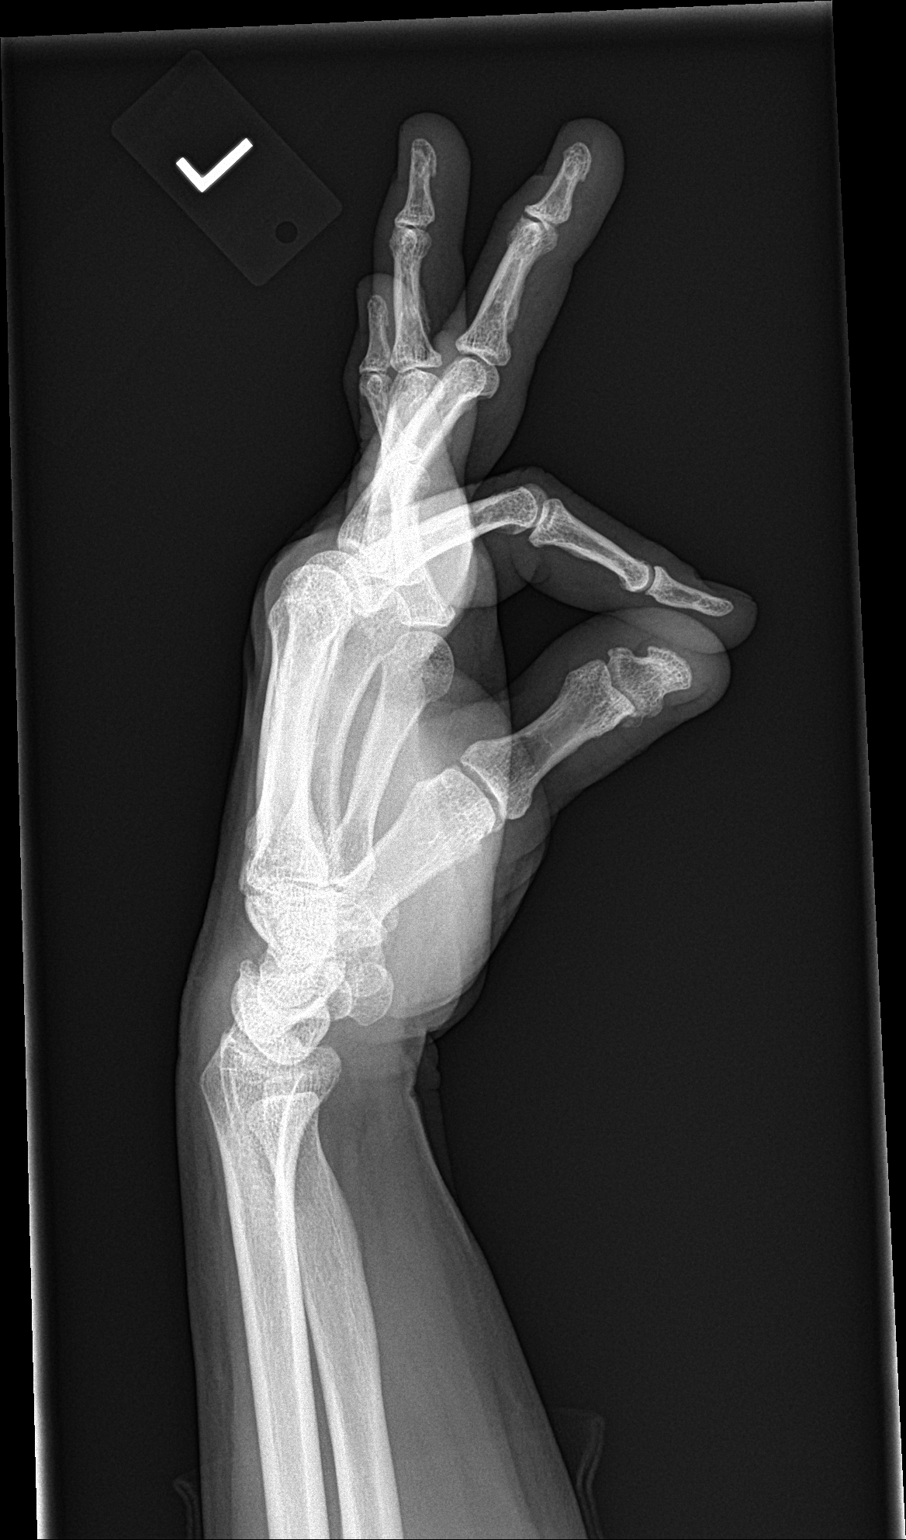

[3 of 3 positions shown; findings below may reference images not displayed]

FINDINGS: No acute fracture or dislocation is noted. No gross soft tissue
abnormality is seen. Minimal degenerative changes in the
interphalangeal joints are noted. No specific abnormality is noted
at the fourth PIP joint.
IMPRESSION: Mild degenerative change without acute abnormality.

## 2019-02-08 ENCOUNTER — Other Ambulatory Visit: Payer: Self-pay | Admitting: Family Medicine

## 2019-03-23 ENCOUNTER — Other Ambulatory Visit: Payer: Self-pay | Admitting: Family Medicine

## 2019-03-23 NOTE — Telephone Encounter (Signed)
Last seen 11/03/2019

## 2019-05-13 ENCOUNTER — Ambulatory Visit (INDEPENDENT_AMBULATORY_CARE_PROVIDER_SITE_OTHER): Payer: Commercial Managed Care - PPO | Admitting: Physician Assistant

## 2019-05-13 ENCOUNTER — Other Ambulatory Visit: Payer: Self-pay

## 2019-05-13 DIAGNOSIS — J0110 Acute frontal sinusitis, unspecified: Secondary | ICD-10-CM

## 2019-05-13 MED ORDER — AMOXICILLIN 500 MG PO CAPS: 1000.0000 mg | ORAL_CAPSULE | Freq: Two times a day (BID) | ORAL | 0 refills | Status: DC

## 2019-05-13 MED ORDER — FLUCONAZOLE 150 MG PO TABS: 150.0000 mg | ORAL_TABLET | Freq: Once | ORAL | 0 refills | Status: AC

## 2019-05-16 ENCOUNTER — Encounter: Payer: Self-pay | Admitting: Physician Assistant

## 2019-05-16 NOTE — Progress Notes (Signed)
     Telephone visit  Subjective: GU:YQIHK, nausea PCP: Sharion Balloon, FNP [id number] Crystal [name] is a 58 y.o. female calls for telephone consult today. Patient provides verbal consent for consult held via phone.  Patient is identified with 2 separate identifiers.  At this time the entire area is on COVID-19 social distancing and stay home orders are in place.  Patient is of higher risk and therefore we are performing this by a virtual method.  Location of patient: home Location of provider: [facility] Others present for call: no  This patient has had many days of sinus headache and postnasal drainage. There is copious drainage at times. Denies any fever at this time. There has been a history of sinus infections in the past.  No history of sinus surgery. There is cough at night. It has become more prevalent in recent days. She has had a little bit of upset stomach, she knows she has a lot of postnasal drainage.  She has no COVID exposure.   ROS: Per HPI  Allergies  Allergen Reactions  . Reglan [Metoclopramide] Other (See Comments)    Muscle spasms  . Sulfa Antibiotics Anaphylaxis   Past Medical History:  Diagnosis Date  . IBS (irritable bowel syndrome)   . Post herpetic neuralgia     Current Outpatient Medications:  .  amoxicillin (AMOXIL) 500 MG capsule, Take 2 capsules (1,000 mg total) by mouth 2 (two) times daily., Disp: 40 capsule, Rfl: 0 .  gabapentin (NEURONTIN) 800 MG tablet, TAKE 1 TABLET BY MOUTH 3 TIMES DAILY., Disp: 90 tablet, Rfl: 0 .  lidocaine (LIDODERM) 5 %, Place 1 patch onto the skin daily. Remove & Discard patch within 12 hours or as directed by MD, Disp: 30 patch, Rfl: 3 .  tizanidine (ZANAFLEX) 2 MG capsule, Take 1 capsule (2 mg total) by mouth 3 (three) times daily as needed for muscle spasms., Disp: 90 capsule, Rfl: 2 .  venlafaxine XR (EFFEXOR-XR) 150 MG 24 hr capsule, Take 1 capsule (150 mg total) by mouth daily., Disp: 90 capsule, Rfl: 3  Assessment/ Plan:  58 y.o. female   1. Acute non-recurrent frontal sinusitis - amoxicillin (AMOXIL) 500 MG capsule; Take 2 capsules (1,000 mg total) by mouth 2 (two) times daily.  Dispense: 40 capsule; Refill: 0 - fluconazole (DIFLUCAN) 150 MG tablet; Take 1 tablet (150 mg total) by mouth once for 1 dose.  Dispense: 1 tablet; Refill: 0   Continue all other maintenance medications as listed above.  Start time: 10:14 AM End time: 10:20 AM  Meds ordered this encounter  Medications  . amoxicillin (AMOXIL) 500 MG capsule    Sig: Take 2 capsules (1,000 mg total) by mouth 2 (two) times daily.    Dispense:  40 capsule    Refill:  0    Order Specific Question:   Supervising Provider    Answer:   Janora Norlander [[id number]]  . fluconazole (DIFLUCAN) 150 MG tablet    Sig: Take 1 tablet (150 mg total) by mouth once for 1 dose.    Dispense:  1 tablet    Refill:  0    Order Specific Question:   Supervising Provider    Answer:   Janora Norlander [[id number]]    Particia Nearing PA-C [place] [phone]

## 2019-05-27 ENCOUNTER — Telehealth: Payer: Self-pay | Admitting: *Deleted

## 2019-05-27 ENCOUNTER — Other Ambulatory Visit: Payer: PRIVATE HEALTH INSURANCE

## 2019-05-27 ENCOUNTER — Ambulatory Visit (INDEPENDENT_AMBULATORY_CARE_PROVIDER_SITE_OTHER): Payer: Commercial Managed Care - PPO | Admitting: Family

## 2019-05-27 ENCOUNTER — Encounter: Payer: Self-pay | Admitting: Family

## 2019-05-27 DIAGNOSIS — R059 Cough, unspecified: Secondary | ICD-10-CM

## 2019-05-27 DIAGNOSIS — Z20822 Contact with and (suspected) exposure to covid-19: Secondary | ICD-10-CM

## 2019-05-27 DIAGNOSIS — R05 Cough: Secondary | ICD-10-CM

## 2019-05-27 DIAGNOSIS — R51 Headache: Secondary | ICD-10-CM | POA: Diagnosis not present

## 2019-05-27 DIAGNOSIS — R519 Headache, unspecified: Secondary | ICD-10-CM

## 2019-05-27 NOTE — Progress Notes (Signed)
   Virtual Visit via telephone Note  I connected with Crystal [name] on 05/27/19 at 11:31 AM by telephone and verified that I am speaking with the correct person using two identifiers. Crystal [name] is currently located at home and no one is currently with her during visit. The provider, Evelina Dun, FNP is located in their office at time of visit.  I discussed the limitations, risks, security and privacy concerns of performing an evaluation and management service by telephone and the availability of in person appointments. I also discussed with the patient that there may be a patient responsible charge related to this service. The patient expressed understanding and agreed to proceed.   History and Present Illness:  Pt calls the office today with recurrent cough. She has a virtual visit on 05/13/19 and was diagnosed with a sinus infection. She was prescribed Amoxicillin, but states her symptoms have not improved, but have worsen. Cough This is a recurrent problem. The current episode started 1 to 4 weeks ago. The cough is productive of purulent sputum. Associated symptoms include ear congestion, ear pain, headaches, nasal congestion, postnasal drip and a sore throat. Pertinent negatives include no fever, rhinorrhea, shortness of breath or wheezing. She has tried OTC cough suppressant (augmentin) for the symptoms. The treatment provided mild relief.      Review of Systems  Constitutional: Negative for fever.  HENT: Positive for ear pain, postnasal drip and sore throat. Negative for rhinorrhea.   Respiratory: Positive for cough. Negative for shortness of breath and wheezing.   Neurological: Positive for headaches.     Observations/Objective: No SOB or distress, hoarse voice  Assessment and Plan: Crystal [name] comes in today with chief complaint of No chief complaint on file.   Diagnosis and orders addressed:  1. Cough  2. Nonintractable headache, unspecified chronicity pattern,  unspecified headache type  - Take meds as prescribed - Use a cool mist humidifier  -Use saline nose sprays frequently -Force fluids -For any cough or congestion  Use plain Mucinex- regular strength or max strength is fine -For fever or aces or pains- take tylenol or ibuprofen. -Throat lozenges if help -Will send for COVID testing Start using Tessalon as needed for cough   Evelina Dun, FNP      I discussed the assessment and treatment plan with the patient. The patient was provided an opportunity to ask questions and all were answered. The patient agreed with the plan and demonstrated an understanding of the instructions.   The patient was advised to call back or seek an in-person evaluation if the symptoms worsen or if the condition fails to improve as anticipated.  The above assessment and management plan was discussed with the patient. The patient verbalized understanding of and has agreed to the management plan. Patient is aware to call the clinic if symptoms persist or worsen. Patient is aware when to return to the clinic for a follow-up visit. Patient educated on when it is appropriate to go to the emergency department.   Time call ended: 11:43 AM   I provided 12 minutes of non-face-to-face time during this encounter.    Evelina Dun, FNP

## 2019-05-27 NOTE — Telephone Encounter (Signed)
Scheduled patient for COVID test today at [facility] at 12:30 pm.  Testing protocol reviewed with patient.

## 2019-06-01 ENCOUNTER — Telehealth: Payer: Self-pay

## 2019-06-01 NOTE — Telephone Encounter (Signed)
Pt called for results of Covid test results not bak yet.

## 2019-06-03 LAB — NOVEL CORONAVIRUS, NAA: SARS-CoV-2, NAA: NOT DETECTED

## 2019-12-01 ENCOUNTER — Other Ambulatory Visit: Payer: Self-pay | Admitting: Family Medicine

## 2020-01-04 ENCOUNTER — Ambulatory Visit (INDEPENDENT_AMBULATORY_CARE_PROVIDER_SITE_OTHER): Payer: Commercial Managed Care - PPO | Admitting: Family Medicine

## 2020-01-04 ENCOUNTER — Encounter: Payer: Self-pay | Admitting: Family Medicine

## 2020-01-04 ENCOUNTER — Telehealth: Payer: Self-pay | Admitting: Family Medicine

## 2020-01-04 DIAGNOSIS — R42 Dizziness and giddiness: Secondary | ICD-10-CM

## 2020-01-04 DIAGNOSIS — H6506 Acute serous otitis media, recurrent, bilateral: Secondary | ICD-10-CM | POA: Diagnosis not present

## 2020-01-04 MED ORDER — AMOXICILLIN-POT CLAVULANATE 875-125 MG PO TABS: 1.0000 | ORAL_TABLET | Freq: Two times a day (BID) | ORAL | 0 refills | Status: DC

## 2020-01-04 MED ORDER — TIZANIDINE HCL 2 MG PO CAPS: 2.0000 mg | ORAL_CAPSULE | Freq: Three times a day (TID) | ORAL | 2 refills | Status: DC | PRN

## 2020-01-04 MED ORDER — PREDNISONE 10 MG PO TABS: ORAL_TABLET | ORAL | 0 refills | Status: DC

## 2020-01-04 MED ORDER — VENLAFAXINE HCL ER 150 MG PO CP24: 150.0000 mg | ORAL_CAPSULE | Freq: Every day | ORAL | 3 refills | Status: DC

## 2020-01-04 MED ORDER — GABAPENTIN 800 MG PO TABS: 800.0000 mg | ORAL_TABLET | Freq: Three times a day (TID) | ORAL | 1 refills | Status: DC

## 2020-01-04 NOTE — Progress Notes (Signed)
Subjective:    Patient ID: Crystal [name], female    DOB: 03-18-1961, 59 y.o.   MRN: [id number]   HPI: Crystal [name] is a 59 y.o. female presenting for one month of intermittent ear pain. OTC meds not helping. Getting vertigo. Had to hold onto the counter this morning. Nausea and dizziness, low grade fever to 100 degrees. Has had fluid behind ear drum that caused same symptoms in the past.  Patient had her second Covid shot 2 weeks ago.  The symptoms actually started prior to her getting her first Covid shot several weeks ago.  The ear pain is primarily on the right.   Depression screen [facility] 2/9 05/22/2018 07/19/2017 05/22/2017 02/13/2017 09/09/2016  Decreased Interest 0 0 0 0 0  Down, Depressed, Hopeless 0 0 0 0 0  PHQ - 2 Score 0 0 0 0 0  Altered sleeping - - - 0 -  Tired, decreased energy - - - 2 -  Change in appetite - - - 0 -  Feeling bad or failure about yourself  - - - 0 -  Trouble concentrating - - - 0 -  Moving slowly or fidgety/restless - - - 0 -  Suicidal thoughts - - - 0 -  PHQ-9 Score - - - 2 -     Relevant past medical, surgical, family and social history reviewed and updated as indicated.  Interim medical history since our last visit reviewed. Allergies and medications reviewed and updated.  ROS:  Review of Systems  Constitutional: Negative for appetite change, chills, diaphoresis, fatigue and fever.  HENT: Positive for congestion, ear pain, facial swelling and hearing loss. Negative for sore throat and trouble swallowing.   Respiratory: Negative for cough, chest tightness and shortness of breath.   Cardiovascular: Negative for chest pain and palpitations.  Gastrointestinal: Negative for abdominal pain.  Musculoskeletal: Negative for arthralgias.  Skin: Negative for rash.     Social History   Tobacco Use  Smoking Status Former Smoker  . Packs/day: 0.50  . Types: Cigarettes  . Quit date: 05/02/2012  . Years since quitting: 7.6  Smokeless Tobacco Never Used        Objective:     Wt Readings from Last 3 Encounters:  12/23/18 145 lb 3.2 oz (65.9 kg)  05/22/18 144 lb (65.3 kg)  07/19/17 146 lb 3.2 oz (66.3 kg)     Exam deferred. Pt. Harboring due to COVID 19. Phone visit performed.   Assessment & Plan:   1. Vertigo   2. Recurrent acute serous otitis media of both ears     Meds ordered this encounter  Medications  . amoxicillin-clavulanate (AUGMENTIN) 875-125 MG tablet    Sig: Take 1 tablet by mouth 2 (two) times daily. Take all of this medication    Dispense:  20 tablet    Refill:  0  . predniSONE (DELTASONE) 10 MG tablet    Sig: Take 5 daily for 2 days followed by 4,3,2 and 1 for 2 days each.    Dispense:  30 tablet    Refill:  0  . tizanidine (ZANAFLEX) 2 MG capsule    Sig: Take 1 capsule (2 mg total) by mouth 3 (three) times daily as needed for muscle spasms.    Dispense:  90 capsule    Refill:  2  . venlafaxine XR (EFFEXOR-XR) 150 MG 24 hr capsule    Sig: Take 1 capsule (150 mg total) by mouth daily. (Needs to be seen before next  refill)    Dispense:  90 capsule    Refill:  3  . gabapentin (NEURONTIN) 800 MG tablet    Sig: Take 1 tablet (800 mg total) by mouth 3 (three) times daily.    Dispense:  270 tablet    Refill:  1    No orders of the defined types were placed in this encounter.     Diagnoses and all orders for this visit:  Vertigo  Recurrent acute serous otitis media of both ears  Other orders -     amoxicillin-clavulanate (AUGMENTIN) 875-125 MG tablet; Take 1 tablet by mouth 2 (two) times daily. Take all of this medication -     predniSONE (DELTASONE) 10 MG tablet; Take 5 daily for 2 days followed by 4,3,2 and 1 for 2 days each. -     tizanidine (ZANAFLEX) 2 MG capsule; Take 1 capsule (2 mg total) by mouth 3 (three) times daily as needed for muscle spasms. -     venlafaxine XR (EFFEXOR-XR) 150 MG 24 hr capsule; Take 1 capsule (150 mg total) by mouth daily. (Needs to be seen before next refill) -      gabapentin (NEURONTIN) 800 MG tablet; Take 1 tablet (800 mg total) by mouth 3 (three) times daily.    Virtual Visit via telephone Note  I discussed the limitations, risks, security and privacy concerns of performing an evaluation and management service by telephone and the availability of in person appointments. The patient was identified with two identifiers. Pt.expressed understanding and agreed to proceed. Pt. Is at home. Dr. Livia Snellen is in his office.  Follow Up Instructions:   I discussed the assessment and treatment plan with the patient. The patient was provided an opportunity to ask questions and all were answered. The patient agreed with the plan and demonstrated an understanding of the instructions.   The patient was advised to call back or seek an in-person evaluation if the symptoms worsen or if the condition fails to improve as anticipated.   Total minutes including chart review and phone contact time: 9   Follow up plan: No follow-ups on file.  Crystal Fraise, MD [place]

## 2020-01-04 NOTE — Telephone Encounter (Signed)
Pt needs OV - has been over 1 year.

## 2020-01-05 NOTE — Telephone Encounter (Signed)
Patient aware mediations where sent yesterday.

## 2020-01-05 NOTE — Telephone Encounter (Signed)
Left message for patient to call back to set up an appointment with PCP for medication refill.

## 2020-02-07 ENCOUNTER — Other Ambulatory Visit: Payer: Self-pay | Admitting: Family Medicine

## 2020-03-27 ENCOUNTER — Ambulatory Visit (INDEPENDENT_AMBULATORY_CARE_PROVIDER_SITE_OTHER): Payer: Commercial Managed Care - PPO | Admitting: Family Medicine

## 2020-03-27 ENCOUNTER — Encounter: Payer: Self-pay | Admitting: Family Medicine

## 2020-03-27 DIAGNOSIS — J301 Allergic rhinitis due to pollen: Secondary | ICD-10-CM

## 2020-03-27 DIAGNOSIS — J0100 Acute maxillary sinusitis, unspecified: Secondary | ICD-10-CM | POA: Diagnosis not present

## 2020-03-27 MED ORDER — AMOXICILLIN-POT CLAVULANATE 875-125 MG PO TABS: 1.0000 | ORAL_TABLET | Freq: Two times a day (BID) | ORAL | 0 refills | Status: DC

## 2020-03-27 MED ORDER — FLUCONAZOLE 150 MG PO TABS: 150.0000 mg | ORAL_TABLET | Freq: Once | ORAL | 0 refills | Status: AC

## 2020-03-27 MED ORDER — FEXOFENADINE-PSEUDOEPHED ER 180-240 MG PO TB24: 1.0000 | ORAL_TABLET | Freq: Every evening | ORAL | 11 refills | Status: DC

## 2020-03-27 MED ORDER — PREDNISONE 10 MG PO TABS: ORAL_TABLET | ORAL | 0 refills | Status: DC

## 2020-03-27 NOTE — Progress Notes (Signed)
Subjective:    Patient ID: Crystal [name], female    DOB: 1961-07-04, 59 y.o.   MRN: [id number]   HPI: Crystal [name] is a 59 y.o. female presenting for terrible sinus HA for 3 days. Associated with nausea. Ears stopped up. Post nasal drainage. Some cough. Tried zyrtec D. No relief. Flonase helped some Feels congestion. HA is pressure, fullness between her eyes.  Eyes and ears also itch. No fever. No dyspnea   Depression screen [facility] 2/9 05/22/2018 07/19/2017 05/22/2017 02/13/2017 09/09/2016  Decreased Interest 0 0 0 0 0  Down, Depressed, Hopeless 0 0 0 0 0  PHQ - 2 Score 0 0 0 0 0  Altered sleeping - - - 0 -  Tired, decreased energy - - - 2 -  Change in appetite - - - 0 -  Feeling bad or failure about yourself  - - - 0 -  Trouble concentrating - - - 0 -  Moving slowly or fidgety/restless - - - 0 -  Suicidal thoughts - - - 0 -  PHQ-9 Score - - - 2 -     Relevant past medical, surgical, family and social history reviewed and updated as indicated.  Interim medical history since our last visit reviewed. Allergies and medications reviewed and updated.  ROS:  Review of Systems  Constitutional: Negative for activity change, appetite change, chills and fever.  HENT: Positive for congestion, postnasal drip, rhinorrhea and sinus pressure. Negative for ear discharge, ear pain, hearing loss, nosebleeds, sneezing and trouble swallowing.   Respiratory: Negative for chest tightness and shortness of breath.   Cardiovascular: Negative for chest pain and palpitations.  Skin: Negative for rash.     Social History   Tobacco Use  Smoking Status Former Smoker  . Packs/day: 0.50  . Types: Cigarettes  . Quit date: 05/02/2012  . Years since quitting: 7.9  Smokeless Tobacco Never Used       Objective:     Wt Readings from Last 3 Encounters:  12/23/18 145 lb 3.2 oz (65.9 kg)  05/22/18 144 lb (65.3 kg)  07/19/17 146 lb 3.2 oz (66.3 kg)     Exam deferred. Pt. Harboring due to COVID 19.  Phone visit performed.   Assessment & Plan:   1. Acute maxillary sinusitis, recurrence not specified   2. Seasonal allergic rhinitis due to pollen     Meds ordered this encounter  Medications  . fexofenadine-pseudoephedrine (ALLEGRA-D 24) 180-240 MG 24 hr tablet    Sig: Take 1 tablet by mouth every evening. For allergy and congestion    Dispense:  30 tablet    Refill:  11  . fluconazole (DIFLUCAN) 150 MG tablet    Sig: Take 1 tablet (150 mg total) by mouth once for 1 dose. At onset of symptoms. Repeat at end of treatment    Dispense:  2 tablet    Refill:  0  . predniSONE (DELTASONE) 10 MG tablet    Sig: Take 5 daily for 2 days followed by 4,3,2 and 1 for 2 days each.    Dispense:  30 tablet    Refill:  0  . amoxicillin-clavulanate (AUGMENTIN) 875-125 MG tablet    Sig: Take 1 tablet by mouth 2 (two) times daily. Take all of this medication    Dispense:  20 tablet    Refill:  0    No orders of the defined types were placed in this encounter.     Diagnoses and all orders for this  visit:  Acute maxillary sinusitis, recurrence not specified  Seasonal allergic rhinitis due to pollen  Other orders -     fexofenadine-pseudoephedrine (ALLEGRA-D 24) 180-240 MG 24 hr tablet; Take 1 tablet by mouth every evening. For allergy and congestion -     fluconazole (DIFLUCAN) 150 MG tablet; Take 1 tablet (150 mg total) by mouth once for 1 dose. At onset of symptoms. Repeat at end of treatment -     predniSONE (DELTASONE) 10 MG tablet; Take 5 daily for 2 days followed by 4,3,2 and 1 for 2 days each. -     amoxicillin-clavulanate (AUGMENTIN) 875-125 MG tablet; Take 1 tablet by mouth 2 (two) times daily. Take all of this medication    Virtual Visit via telephone Note  I discussed the limitations, risks, security and privacy concerns of performing an evaluation and management service by telephone and the availability of in person appointments. The patient was identified with two identifiers.  Pt.expressed understanding and agreed to proceed. Pt. Is at home. Dr. Livia Snellen is in his office.  Follow Up Instructions:   I discussed the assessment and treatment plan with the patient. The patient was provided an opportunity to ask questions and all were answered. The patient agreed with the plan and demonstrated an understanding of the instructions.   The patient was advised to call back or seek an in-person evaluation if the symptoms worsen or if the condition fails to improve as anticipated.   Total minutes including chart review and phone contact time: 8   Follow up plan: Return if symptoms worsen or fail to improve.  [name], MD [place]

## 2020-03-29 ENCOUNTER — Telehealth: Payer: Self-pay | Admitting: Family Medicine

## 2020-03-29 NOTE — Telephone Encounter (Signed)
Okay to write the note. Also I would give it another day or two unless the fever is 102 or higher, or if she is significantly short of breath

## 2020-03-29 NOTE — Telephone Encounter (Signed)
Note written and faxed to [facility] xray dept at [phone] per patients request.

## 2020-03-29 NOTE — Telephone Encounter (Signed)
Pt called stating that she had a televisit with Dr Darlyn Read on Monday 03/27/20 and says that she is still sick and has a fever. Wants to know if Dr Darlyn Read can write her a note for work. Says she would like to go back tomorrow if she feels better and has no temp.

## 2020-03-29 NOTE — Telephone Encounter (Signed)
Is this note ok? Should she have further testing if still sick? Please advise

## 2020-04-04 ENCOUNTER — Other Ambulatory Visit: Payer: Self-pay | Admitting: Family Medicine

## 2020-04-04 ENCOUNTER — Telehealth: Payer: Self-pay | Admitting: Family Medicine

## 2020-04-04 DIAGNOSIS — J329 Chronic sinusitis, unspecified: Secondary | ICD-10-CM

## 2020-04-04 NOTE — Telephone Encounter (Signed)
  REFERRAL REQUEST Telephone Note 04/04/2020  What type of referral do you need? CT of full sinus  Have you been seen at our office for this problem? Yes (Advise that they may need an appointment with their PCP before a referral can be done)  Is there a particular doctor or location that you prefer? [facility]?  Patient notified that referrals can take up to a week or longer to process. If they haven't heard anything within a week they should call back and speak with the referral department.   Pt did televisit for sinus & they aren't any better & she wants to know why she keeps having headaches from sinus.  Stacks' pt.

## 2020-04-04 NOTE — Telephone Encounter (Signed)
I placed the order.

## 2020-04-11 ENCOUNTER — Telehealth: Payer: Self-pay | Admitting: Family Medicine

## 2020-04-11 NOTE — Telephone Encounter (Signed)
The CT scan of the sinuses was normal. If headaches persist arrange in-office follow up

## 2020-04-11 NOTE — Telephone Encounter (Signed)
Patient aware and verbalizes understanding. 

## 2020-10-23 ENCOUNTER — Telehealth: Payer: Self-pay

## 2020-10-23 MED ORDER — VENLAFAXINE HCL ER 150 MG PO CP24: 150.0000 mg | ORAL_CAPSULE | Freq: Every day | ORAL | 0 refills | Status: DC

## 2020-10-23 MED ORDER — FEXOFENADINE-PSEUDOEPHED ER 180-240 MG PO TB24: 1.0000 | ORAL_TABLET | Freq: Every evening | ORAL | 0 refills | Status: DC

## 2020-10-23 MED ORDER — GABAPENTIN 800 MG PO TABS: 800.0000 mg | ORAL_TABLET | Freq: Three times a day (TID) | ORAL | 0 refills | Status: DC

## 2020-10-23 NOTE — Telephone Encounter (Signed)
Pt aware refills sent to [facility]

## 2020-10-23 NOTE — Telephone Encounter (Signed)
  Prescription Request  10/23/2020  What is the name of the medication or equipment? ALL  Have you contacted your pharmacy to request a refill? (if applicable) Yes  Which pharmacy would you like this sent to? [facility]  Pt scheduled appt to see Dr Darlyn Read for check up/med refill on 11/27/2020 (dr stacks first available appt slot) but needs refills called in to pharmacy to last her until her appt.   Patient notified that their request is being sent to the clinical staff for review and that they should receive a response within 2 business days.

## 2020-11-27 ENCOUNTER — Ambulatory Visit: Payer: Commercial Managed Care - PPO | Admitting: Family Medicine

## 2020-11-28 ENCOUNTER — Other Ambulatory Visit: Payer: Self-pay | Admitting: Family Medicine

## 2020-12-11 ENCOUNTER — Encounter: Payer: Self-pay | Admitting: Family Medicine

## 2020-12-11 ENCOUNTER — Ambulatory Visit (INDEPENDENT_AMBULATORY_CARE_PROVIDER_SITE_OTHER): Payer: Commercial Managed Care - PPO | Admitting: Family Medicine

## 2020-12-11 DIAGNOSIS — M159 Polyosteoarthritis, unspecified: Secondary | ICD-10-CM

## 2020-12-11 DIAGNOSIS — H209 Unspecified iridocyclitis: Secondary | ICD-10-CM

## 2020-12-11 DIAGNOSIS — M8949 Other hypertrophic osteoarthropathy, multiple sites: Secondary | ICD-10-CM | POA: Diagnosis not present

## 2020-12-11 DIAGNOSIS — K580 Irritable bowel syndrome with diarrhea: Secondary | ICD-10-CM | POA: Diagnosis not present

## 2020-12-11 DIAGNOSIS — K635 Polyp of colon: Secondary | ICD-10-CM

## 2020-12-11 MED ORDER — PREDNISONE 10 MG PO TABS: ORAL_TABLET | ORAL | 0 refills | Status: DC

## 2020-12-11 NOTE — Progress Notes (Signed)
Subjective:    Patient ID: Crystal [name], female    DOB: 06-29-1961, 60 y.o.   MRN: [id number]   HPI: Crystal [name] is a 60 y.o. female presenting for arthritis flaring. Bones ache. Elbows & shoulders especially. Got sick a week ago. Got sulfa at [facility] went to ophthalmology and was dxed with uveitis.  Now getting better with a different eye drop, prednisolone. Not taking the gabapentin regularly.  Also has IBS with diarrhea. Occurs 2-4 times a week. Had workup with colonoscopy a few years ago. Can have 7 watery BMs in a day with a flare. Mom had colon cancer, survived. Pt.  had polyp in 2013 on colonoscopy, needs follow up for 1 cm and a smaller  polyp that were found. No report of pathology available at this time.    Depression screen [facility] 2/9 05/22/2018 07/19/2017 05/22/2017 02/13/2017 09/09/2016  Decreased Interest 0 0 0 0 0  Down, Depressed, Hopeless 0 0 0 0 0  PHQ - 2 Score 0 0 0 0 0  Altered sleeping - - - 0 -  Tired, decreased energy - - - 2 -  Change in appetite - - - 0 -  Feeling bad or failure about yourself  - - - 0 -  Trouble concentrating - - - 0 -  Moving slowly or fidgety/restless - - - 0 -  Suicidal thoughts - - - 0 -  PHQ-9 Score - - - 2 -     Relevant past medical, surgical, family and social history reviewed and updated as indicated.  Interim medical history since our last visit reviewed. Allergies and medications reviewed and updated.  ROS:  Review of Systems  Constitutional: Negative.   HENT: Negative.   Eyes: Negative for visual disturbance.  Respiratory: Negative for shortness of breath.   Cardiovascular: Negative for chest pain.  Gastrointestinal: Positive for abdominal distention, abdominal pain and diarrhea. Negative for blood in stool and constipation.  Musculoskeletal: Positive for arthralgias.     Social History   Tobacco Use  Smoking Status Former Smoker  . Packs/day: 0.50  . Types: Cigarettes  . Quit date: 05/02/2012  . Years since  quitting: 8.6  Smokeless Tobacco Never Used       Objective:     Wt Readings from Last 3 Encounters:  12/23/18 145 lb 3.2 oz (65.9 kg)  05/22/18 144 lb (65.3 kg)  07/19/17 146 lb 3.2 oz (66.3 kg)     Exam deferred. Pt. Harboring due to COVID 19. Phone visit performed.   Assessment & Plan:   1. Polyp of colon, unspecified part of colon, unspecified type   2. Primary osteoarthritis involving multiple joints   3. Irritable bowel syndrome with diarrhea   4. Uveitis     Meds ordered this encounter  Medications  . predniSONE (DELTASONE) 10 MG tablet    Sig: Take 5 daily for 2 days followed by 4,3,2 and 1 for 2 days each.    Dispense:  30 tablet    Refill:  0    Orders Placed This Encounter  Procedures  . Ambulatory referral to Gastroenterology    Referral Priority:   Routine    Referral Type:   Consultation    Referral Reason:   Specialty Services Required    Number of Visits Requested:   1      Diagnoses and all orders for this visit:  Polyp of colon, unspecified part of colon, unspecified type -     Ambulatory  referral to Gastroenterology  Primary osteoarthritis involving multiple joints  Irritable bowel syndrome with diarrhea -     Ambulatory referral to Gastroenterology  Uveitis  Other orders -     predniSONE (DELTASONE) 10 MG tablet; Take 5 daily for 2 days followed by 4,3,2 and 1 for 2 days each.  I encouraged Ms. Palma to use the gabapentin TID as ordered. If pain relief is inadequate then she will need follow up in one month to consider alternatives.  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 If relief is inadequate, will need work up. THis was last done in 2019. Those labs were reviewed as part of todays evaluation  Virtual Visit via telephone Note  I discussed the limitations, risks, security and privacy concerns of performing an evaluation and management service by telephone and the availability of in person appointments. The patient was  identified with two identifiers. Pt.expressed understanding and agreed to proceed. Pt. Is at home. Dr. Darlyn Read is in his office.  Follow Up Instructions:   I discussed the assessment and treatment plan with the patient. The patient was provided an opportunity to ask questions and all were answered. The patient agreed with the plan and demonstrated an understanding of the instructions.   The patient was advised to call back or seek an in-person evaluation if the symptoms worsen or if the condition fails to improve as anticipated.  Total minutes including chart review and phone contact time: 32   Follow up plan: Return in about 1 month (around 01/11/2021).  Mechele Claude, MD [place] [facility]

## 2020-12-12 ENCOUNTER — Other Ambulatory Visit: Payer: Self-pay | Admitting: Family Medicine

## 2020-12-14 ENCOUNTER — Encounter (INDEPENDENT_AMBULATORY_CARE_PROVIDER_SITE_OTHER): Payer: Self-pay | Admitting: *Deleted

## 2020-12-27 ENCOUNTER — Telehealth: Payer: Self-pay

## 2020-12-27 NOTE — Telephone Encounter (Signed)
Stacks, any thoughts?

## 2020-12-27 NOTE — Telephone Encounter (Signed)
Patient aware of recommendations and states that diarrhea is better since taking immodium regularly

## 2020-12-27 NOTE — Telephone Encounter (Signed)
Go to the consult and see what they have to say. USE Immodium until then

## 2020-12-28 ENCOUNTER — Other Ambulatory Visit: Payer: Self-pay | Admitting: Family Medicine

## 2020-12-29 ENCOUNTER — Telehealth: Payer: Self-pay

## 2020-12-29 NOTE — Telephone Encounter (Signed)
Pt called stating that she had her colonoscopy consult yesterday and they are going to send her to [facility] to have the Colonoscopy but have not scheduled it yet.   Pt says she is still having Fecal incontinence and says she has tried taking Immodium repeatedly (liquid and pills) but says it is not helping.  Wants to know if there is anything else she can take to help until she has her colonoscopy.

## 2020-12-30 ENCOUNTER — Other Ambulatory Visit: Payer: Self-pay | Admitting: Family Medicine

## 2020-12-30 MED ORDER — DIPHENOXYLATE-ATROPINE 2.5-0.025 MG PO TABS
2.0000 | ORAL_TABLET | Freq: Four times a day (QID) | ORAL | 0 refills | Status: DC | PRN
Start: 2020-12-30 — End: 2021-01-18

## 2020-12-30 NOTE — Telephone Encounter (Signed)
Please let the patient know that I sent their prescription to their pharmacy. Thanks, WS 

## 2021-01-01 ENCOUNTER — Telehealth: Payer: Self-pay

## 2021-01-01 NOTE — Telephone Encounter (Signed)
Pt. Needs to be seen for this. Thanks, WS 

## 2021-01-01 NOTE — Telephone Encounter (Signed)
Spoke with patient, appointment scheduled with Dr. Darlyn Read on 01/02/21 at 10:25 am.

## 2021-01-01 NOTE — Telephone Encounter (Signed)
Patient states that she is having abd swelling x 1 wk that is getting worse accompanied by lower abd pains - patient would like to know if we can order an ultrasound of the abd.  Patient would like to go to [facility] for this.  Please review and advise if patient needs office visit first.

## 2021-01-01 NOTE — Telephone Encounter (Signed)
Patient notified

## 2021-01-02 ENCOUNTER — Encounter: Payer: Self-pay | Admitting: Family Medicine

## 2021-01-02 ENCOUNTER — Ambulatory Visit (INDEPENDENT_AMBULATORY_CARE_PROVIDER_SITE_OTHER): Payer: Commercial Managed Care - PPO

## 2021-01-02 ENCOUNTER — Other Ambulatory Visit: Payer: Self-pay

## 2021-01-02 ENCOUNTER — Ambulatory Visit: Payer: Commercial Managed Care - PPO | Admitting: Family Medicine

## 2021-01-02 VITALS — BP 114/62 | HR 60 | Temp 97.7°F | Ht 64.0 in | Wt 154.0 lb

## 2021-01-02 DIAGNOSIS — R109 Unspecified abdominal pain: Secondary | ICD-10-CM

## 2021-01-02 DIAGNOSIS — R159 Full incontinence of feces: Secondary | ICD-10-CM

## 2021-01-02 MED ORDER — LINACLOTIDE 290 MCG PO CAPS: 290.0000 ug | ORAL_CAPSULE | Freq: Every day | ORAL | 2 refills | Status: DC

## 2021-01-02 NOTE — Progress Notes (Signed)
Subjective:  Patient ID: Crystal [name], female    DOB: [birth year]  Age: 60 y.o. MRN: [id number]  CC: Abdominal Pain   Crystal [name] presents for follow-up of the IBS and diarrhea.  She continues to have diarrhea but less than what it was at her check 2 weeks ago.  She does have a colonoscopy planned but no appointment yet as she is being sent to [facility] for that.  She does still have some fecal incontinence..  She reports Imodium has helped some.  Depression screen [facility] 2/9 05/22/2018 07/19/2017 05/22/2017  Decreased Interest 0 0 0  Down, Depressed, Hopeless 0 0 0  PHQ - 2 Score 0 0 0  Altered sleeping - - -  Tired, decreased energy - - -  Change in appetite - - -  Feeling bad or failure about yourself  - - -  Trouble concentrating - - -  Moving slowly or fidgety/restless - - -  Suicidal thoughts - - -  PHQ-9 Score - - -    History [name] has a past medical history of IBS (irritable bowel syndrome) and Post herpetic neuralgia.   She has a past surgical history that includes Tonsillectomy and Eye muscle surgery.   Her family history includes Cancer (age of onset: 23) in her mother; Hypertension in her father.She reports that she quit smoking about 8 years ago. Her smoking use included cigarettes. She smoked 0.50 packs per day. She has never used smokeless tobacco. She reports current alcohol use. She reports that she does not use drugs.    ROS Review of Systems  Constitutional: Negative.   HENT: Negative.   Eyes: Negative for visual disturbance.  Respiratory: Negative for shortness of breath.   Cardiovascular: Negative for chest pain.  Gastrointestinal: Positive for abdominal pain and diarrhea.  Musculoskeletal: Negative for arthralgias.    Objective:  BP 114/62   Pulse 60   Temp 97.7 F (36.5 C) (Temporal)   Ht 5\' 4"  (1.626 m)   Wt 154 lb (69.9 kg)   BMI 26.43 kg/m   BP Readings from Last 3 Encounters:  01/02/21 114/62  12/23/18 118/63  05/22/18 135/69     Wt Readings from Last 3 Encounters:  01/02/21 154 lb (69.9 kg)  12/23/18 145 lb 3.2 oz (65.9 kg)  05/22/18 144 lb (65.3 kg)     Physical Exam Constitutional:      Appearance: She is well-developed and well-nourished.  HENT:     Head: Normocephalic and atraumatic.  Cardiovascular:     Rate and Rhythm: Normal rate and regular rhythm.     Heart sounds: No murmur heard.   Pulmonary:     Effort: Pulmonary effort is normal.     Breath sounds: Normal breath sounds.  Abdominal:     General: Bowel sounds are normal.     Palpations: Abdomen is soft. There is no mass.     Tenderness: There is generalized abdominal tenderness. There is no guarding or rebound.  Skin:    General: Skin is warm and dry.  Neurological:     Mental Status: She is alert and oriented to person, place, and time.  Psychiatric:        Mood and Affect: Mood and affect normal.        Behavior: Behavior normal.       Assessment & Plan:   [name] was seen today for abdominal pain.  Diagnoses and all orders for this visit:  Abdominal pain, unspecified abdominal location -  DG Abd 2 Views; Future -     CT ABDOMEN PELVIS W CONTRAST; Future  Incontinence of feces, unspecified fecal incontinence type -     DG Abd 2 Views; Future -     CT ABDOMEN PELVIS W CONTRAST; Future  Other orders -     linaclotide (LINZESS) 290 MCG CAPS capsule; Take 1 capsule (290 mcg total) by mouth daily.       I have discontinued [name]. Stingley's predniSONE. I am also having her start on linaclotide. Additionally, I am having her maintain her lidocaine, tizanidine, fexofenadine-pseudoephedrine, gabapentin, venlafaxine XR, and diphenoxylate-atropine.  Allergies as of 01/02/2021      Reactions   Reglan [metoclopramide] Other (See Comments)   Muscle spasms   Sulfa Antibiotics Anaphylaxis      Medication List       Accurate as of January 02, 2021 11:59 PM. If you have any questions, ask your nurse or doctor.         STOP taking these medications   predniSONE 10 MG tablet Commonly known as: DELTASONE Stopped by: Mechele Claude, MD     TAKE these medications   diphenoxylate-atropine 2.5-0.025 MG tablet Commonly known as: Lomotil Take 2 tablets by mouth 4 (four) times daily as needed for diarrhea or loose stools.   fexofenadine-pseudoephedrine 180-240 MG 24 hr tablet Commonly known as: ALLEGRA-D 24 Take 1 tablet by mouth every evening. For allergy and congestion   gabapentin 800 MG tablet Commonly known as: NEURONTIN TAKE 1 TABLET BY MOUTH 3 TIMES DAILY.   lidocaine 5 % Commonly known as: Lidoderm Place 1 patch onto the skin daily. Remove & Discard patch within 12 hours or as directed by MD   linaclotide 290 MCG Caps capsule Commonly known as: Linzess Take 1 capsule (290 mcg total) by mouth daily. Started by: Mechele Claude, MD   tizanidine 2 MG capsule Commonly known as: ZANAFLEX Take 1 capsule (2 mg total) by mouth 3 (three) times daily as needed for muscle spasms.   venlafaxine XR 150 MG 24 hr capsule Commonly known as: EFFEXOR-XR TAKE 1 CAPSULE BY MOUTH ONCE DAILY.        Follow-up: Return in about 6 weeks (around 02/13/2021).  Mechele Claude, M.D.

## 2021-01-03 ENCOUNTER — Telehealth: Payer: Self-pay | Admitting: *Deleted

## 2021-01-03 NOTE — Telephone Encounter (Signed)
PA in process/ww  (KeyCherlynn June) - [id number] Need help? Call us at [phone] Status Sent to Plan today Next Steps The plan will fax you a determination, typically within 1 to 5 business days.  Drug Linzess capsules Form [facility] Prescription Drug Prior Authorization Form General Prior Authorizations [phone]

## 2021-01-05 ENCOUNTER — Telehealth: Payer: Self-pay

## 2021-01-05 ENCOUNTER — Encounter: Payer: Self-pay | Admitting: Family Medicine

## 2021-01-10 ENCOUNTER — Other Ambulatory Visit: Payer: Self-pay | Admitting: Family Medicine

## 2021-01-10 ENCOUNTER — Telehealth: Payer: Self-pay

## 2021-01-10 DIAGNOSIS — R109 Unspecified abdominal pain: Secondary | ICD-10-CM

## 2021-01-10 DIAGNOSIS — R159 Full incontinence of feces: Secondary | ICD-10-CM

## 2021-01-10 NOTE — Telephone Encounter (Signed)
For now use Miralax I capful BID, Metamucil 1 tbsp BID and colace 100 mg cap BID. If that fails we can try a prior auth.

## 2021-01-10 NOTE — Telephone Encounter (Signed)
Linzess was DENIED by plan Pt must use step therapy.

## 2021-01-10 NOTE — Telephone Encounter (Signed)
Patient notified

## 2021-01-10 NOTE — Telephone Encounter (Signed)
Pt calling to check on Linzess

## 2021-01-10 NOTE — Telephone Encounter (Signed)
Patient aware and verbalized understanding. Patient aware waiting for for stacks to reply

## 2021-01-10 NOTE — Telephone Encounter (Signed)
Lab ordered and printed. Should be on the printer at the vaccine fridge on side B

## 2021-01-10 NOTE — Telephone Encounter (Signed)
CT department at [facility] needs a BUN & Creatinine order for patient.  Patient is scheduled for CT tomorrow at 11:00 and they need this order as soon as possible.  Please fax to [phone]  Attn:  CT Department

## 2021-01-15 ENCOUNTER — Other Ambulatory Visit: Payer: Self-pay | Admitting: Family Medicine

## 2021-01-18 ENCOUNTER — Telehealth: Payer: Self-pay

## 2021-01-18 ENCOUNTER — Other Ambulatory Visit: Payer: Self-pay | Admitting: Family Medicine

## 2021-01-18 MED ORDER — VIBERZI 100 MG PO TABS: 100.0000 mg | ORAL_TABLET | Freq: Two times a day (BID) | ORAL | 5 refills | Status: DC

## 2021-01-18 NOTE — Telephone Encounter (Signed)
Pt called stating that she was seen last week and her insurance wouldn't cover a medicine that Dr Darlyn Read sent in for her, so she was advised to take laxatives and stool softner. Pt says she has been taking both, but says medicines are not working and is still having fecal incontinence.   Also says she had a CT scan done and no one has called her with the results.  Wants someone to call her ASAP regarding these issues.

## 2021-01-18 NOTE — Telephone Encounter (Signed)
Pt calling to check on the status of CT scan results and to discuss what she needs to do going forward about fecal incontinence because nothing is helping the issue.

## 2021-01-18 NOTE — Telephone Encounter (Signed)
Patient aware of results and new medication sent to pharmacy.  She reports she has an appointment scheduled to see the GI doc for colonoscopy.

## 2021-01-18 NOTE — Telephone Encounter (Signed)
Please let her know that her skin was unremarkable.  She does have some diverticulosis which is harmless and not the cause of her symptoms.  Her previous x-ray showed a great deal of stool that is why I want her using a laxative.  I discontinued the Linzess since she could not afford it.  In its place I sent in Viberzi.  Hopefully this will be more affordable based on insurance coverage.  I cannot guarantee that that she should at least price it with her pharmacist.  Meanwhile encourage her to get the colonoscopy scheduled was being arranged by her GI specialist as of when I saw her a couple of weeks ago.

## 2021-01-18 NOTE — Telephone Encounter (Signed)
Patient is referring to the Linzess and is asking about CT results.

## 2021-01-26 ENCOUNTER — Other Ambulatory Visit: Payer: Self-pay | Admitting: Family Medicine

## 2021-01-26 ENCOUNTER — Telehealth: Payer: Self-pay

## 2021-01-26 MED ORDER — TRULANCE 3 MG PO TABS: 3.0000 mg | ORAL_TABLET | Freq: Every day | ORAL | 2 refills | Status: DC

## 2021-01-26 NOTE — Telephone Encounter (Signed)
I sent in Trulance.  This will replace the Viberzi.  Take it once a day instead.  She should also do the magnesium sulfate.  It is available over-the-counter.  It comes in a bottle that looks somewhat like a soda pop.  She should drink the entire bottle.  She can do this daily for 3 days.  It is pretty much the ultimate for constipation.  She can also take MiraLAX with it 1 capful 4 times a day until symptoms resolve.  Then 1 capful daily

## 2021-01-26 NOTE — Telephone Encounter (Signed)
Patient aware.

## 2021-01-26 NOTE — Telephone Encounter (Signed)
I spoke with pt and she says she still feels as she is full of stool and the metamucil that she has been using isn't helping. She does have GI appt 02/19/21 but wants to know if there is anything else she can do.

## 2021-01-29 ENCOUNTER — Telehealth: Payer: Self-pay

## 2021-01-29 NOTE — Telephone Encounter (Signed)
PA in process  (Key: [id number])CBIP help? Call us at [phone] Status Sent to Plantoday Next Steps The plan will fax you a determination, typically within 1 to 5 business days. Drug Trulance 3MG  tablets

## 2021-01-29 NOTE — Telephone Encounter (Signed)
Try it for constipation.

## 2021-01-30 NOTE — Telephone Encounter (Signed)
Form was faxed in today with more clinical questions - this was filled out and faxed back.  Awaiting response.

## 2021-01-31 ENCOUNTER — Other Ambulatory Visit: Payer: Self-pay | Admitting: Family Medicine

## 2021-01-31 MED ORDER — HYOSCYAMINE SULFATE ER 0.375 MG PO TB12: 0.3750 mg | ORAL_TABLET | Freq: Two times a day (BID) | ORAL | 0 refills | Status: DC

## 2021-01-31 NOTE — Telephone Encounter (Signed)
Pt wanted to see if you would send in Viberzi to see if that will go through. We had already tried the Linzess and it wasn't covered.

## 2021-01-31 NOTE — Telephone Encounter (Signed)
Patient aware.

## 2021-01-31 NOTE — Telephone Encounter (Signed)
Patient aware and has GI appointment the end of the month.

## 2021-01-31 NOTE — Telephone Encounter (Signed)
Viberzi was declined on 3/4. Linzess was declined on 2/24. I will send in Levbid

## 2021-01-31 NOTE — Telephone Encounter (Signed)
Trulance was denied. If you do not have known or suspected mechanical gastrointestinal obstruction or have tried and failed 1 of the preferred formulary alternative treatment of your condition lactulose or polyethylene glycol

## 2021-01-31 NOTE — Telephone Encounter (Signed)
Nothing I am aware of. She will need to see GI

## 2021-02-21 ENCOUNTER — Other Ambulatory Visit: Payer: Self-pay | Admitting: Family Medicine

## 2021-03-28 ENCOUNTER — Other Ambulatory Visit: Payer: Self-pay | Admitting: Family Medicine

## 2021-04-27 ENCOUNTER — Other Ambulatory Visit: Payer: Self-pay | Admitting: Family Medicine

## 2021-04-27 ENCOUNTER — Other Ambulatory Visit: Payer: Self-pay

## 2021-04-27 MED ORDER — VENLAFAXINE HCL ER 150 MG PO CP24: 150.0000 mg | ORAL_CAPSULE | Freq: Every day | ORAL | 0 refills | Status: DC

## 2021-04-27 NOTE — Telephone Encounter (Signed)
Stacks NTBS 30 days given 03/28/21

## 2021-05-08 ENCOUNTER — Ambulatory Visit: Payer: Self-pay | Admitting: Family Medicine

## 2021-05-08 ENCOUNTER — Telehealth: Payer: Self-pay | Admitting: Family Medicine

## 2021-05-08 MED ORDER — GABAPENTIN 800 MG PO TABS: 800.0000 mg | ORAL_TABLET | Freq: Three times a day (TID) | ORAL | 0 refills | Status: DC

## 2021-05-08 NOTE — Telephone Encounter (Signed)
  Prescription Request  05/08/2021  What is the name of the medication or equipment? gabapentin (NEURONTIN) 800 MG tablet   Have you contacted your pharmacy to request a refill? (if applicable) No, pt was scheduled for appt today for med refill but her mother had an accident and had to be hospitalized would like a refill until her appt on 05/31/2021  Which pharmacy would you like this sent to? [facility]   Patient notified that their request is being sent to the clinical staff for review and that they should receive a response within 2 business days.

## 2021-05-08 NOTE — Telephone Encounter (Signed)
1 Refill sent

## 2021-05-31 ENCOUNTER — Encounter: Payer: Self-pay | Admitting: Family Medicine

## 2021-05-31 ENCOUNTER — Other Ambulatory Visit: Payer: Self-pay

## 2021-05-31 ENCOUNTER — Ambulatory Visit (INDEPENDENT_AMBULATORY_CARE_PROVIDER_SITE_OTHER): Payer: Commercial Managed Care - PPO | Admitting: Family Medicine

## 2021-05-31 VITALS — BP 138/68 | HR 72 | Temp 97.8°F | Ht 64.0 in | Wt 145.4 lb

## 2021-05-31 DIAGNOSIS — N951 Menopausal and female climacteric states: Secondary | ICD-10-CM | POA: Diagnosis not present

## 2021-05-31 DIAGNOSIS — M5412 Radiculopathy, cervical region: Secondary | ICD-10-CM | POA: Diagnosis not present

## 2021-05-31 DIAGNOSIS — M19042 Primary osteoarthritis, left hand: Secondary | ICD-10-CM | POA: Diagnosis not present

## 2021-05-31 DIAGNOSIS — K589 Irritable bowel syndrome without diarrhea: Secondary | ICD-10-CM | POA: Diagnosis not present

## 2021-05-31 MED ORDER — TIZANIDINE HCL 2 MG PO TABS
ORAL_TABLET | ORAL | 1 refills | Status: DC
Start: 2021-05-31 — End: 2022-02-09

## 2021-05-31 MED ORDER — VENLAFAXINE HCL ER 75 MG PO CP24: 225.0000 mg | ORAL_CAPSULE | Freq: Every day | ORAL | 1 refills | Status: DC

## 2021-05-31 MED ORDER — GABAPENTIN 800 MG PO TABS: 800.0000 mg | ORAL_TABLET | Freq: Three times a day (TID) | ORAL | 3 refills | Status: DC

## 2021-05-31 MED ORDER — FEXOFENADINE-PSEUDOEPHED ER 180-240 MG PO TB24: 1.0000 | ORAL_TABLET | Freq: Every evening | ORAL | 3 refills | Status: DC

## 2021-05-31 NOTE — Progress Notes (Signed)
Subjective:  Patient ID: Crystal [name], female    DOB: 07/27/1961  Age: 60 y.o. MRN: [id number]  CC: Medication Refill   HPI Crystal [name] presents for check up. Has PHN aggrevated by work exertion. Using gabapentin and prn tizanidine. Has night sweats that partialy respond to venlafaxine  Mom passed away 2 weeks ago. [place] and broke several ribs anad punctured a lung  Getting colonoscopy tomorrow at [facility].  Had anorectal manometry. Crystal [name] failed it and declined therapy.   A lot of arthritis at elbows.   Depression screen [facility] 2/9 05/31/2021 05/22/2018 07/19/2017  Decreased Interest 0 0 0  Down, Depressed, Hopeless 0 0 0  PHQ - 2 Score 0 0 0  Altered sleeping - - -  Tired, decreased energy - - -  Change in appetite - - -  Feeling bad or failure about yourself  - - -  Trouble concentrating - - -  Moving slowly or fidgety/restless - - -  Suicidal thoughts - - -  PHQ-9 Score - - -    History Crystal [name] has a past medical history of IBS (irritable bowel syndrome) and Post herpetic neuralgia.   Crystal [name] has a past surgical history that includes Tonsillectomy and Eye muscle surgery.   Her family history includes Cancer (age of onset: 58) in her mother; Hypertension in her father.Crystal [name] reports that Crystal [name] quit smoking about 9 years ago. Her smoking use included cigarettes. Crystal [name] smoked an average of 0.50 packs per day. Crystal [name] has never used smokeless tobacco. Crystal [name] reports current alcohol use. Crystal [name] reports that Crystal [name] does not use drugs.    ROS Review of Systems  Constitutional: Negative.   HENT: Negative.    Eyes:  Negative for visual disturbance.  Respiratory:  Negative for shortness of breath.   Cardiovascular:  Negative for chest pain.  Gastrointestinal:  Negative for abdominal pain.  Musculoskeletal:  Positive for arthralgias.   Objective:  BP 138/68   Pulse 72   Temp 97.8 F (36.6 C)   Ht '5\' 4"'  (1.626 m)   Wt 145 lb 6.4 oz (66 kg)   SpO2 96%   BMI 24.96 kg/m   BP Readings from Last  3 Encounters:  05/31/21 138/68  01/02/21 114/62  12/23/18 118/63    Wt Readings from Last 3 Encounters:  05/31/21 145 lb 6.4 oz (66 kg)  01/02/21 154 lb (69.9 kg)  12/23/18 145 lb 3.2 oz (65.9 kg)     Physical Exam Constitutional:      General: Crystal [name] is not in acute distress.    Appearance: Crystal [name] is well-developed.  HENT:     Head: Normocephalic and atraumatic.  Eyes:     Conjunctiva/sclera: Conjunctivae normal.     Pupils: Pupils are equal, round, and reactive to light.  Neck:     Thyroid: No thyromegaly.  Cardiovascular:     Rate and Rhythm: Normal rate and regular rhythm.     Heart sounds: Normal heart sounds. No murmur heard. Pulmonary:     Effort: Pulmonary effort is normal. No respiratory distress.     Breath sounds: Normal breath sounds. No wheezing or rales.  Abdominal:     General: Bowel sounds are normal. There is no distension.     Palpations: Abdomen is soft.     Tenderness: There is no abdominal tenderness.  Musculoskeletal:        General: Normal range of motion.     Cervical back: Normal range of motion and neck supple.  Lymphadenopathy:  Cervical: No cervical adenopathy.  Skin:    General: Skin is warm and dry.  Neurological:     Mental Status: Crystal [name] is alert and oriented to person, place, and time.  Psychiatric:        Behavior: Behavior normal.        Thought Content: Thought content normal.        Judgment: Judgment normal.      Assessment & Plan:   Crystal [name] was seen today for medication refill.  Diagnoses and all orders for this visit:  Irritable bowel syndrome, unspecified type -     CBC with Differential/Platelet -     CMP14+EGFR  Cervical neuralgia -     CBC with Differential/Platelet -     CMP14+EGFR  Hot flashes due to menopause -     CBC with Differential/Platelet -     CMP14+EGFR  Osteoarthritis of finger of left hand -     CBC with Differential/Platelet -     CMP14+EGFR  Other orders -     gabapentin (NEURONTIN) 800 MG  tablet; Take 1 tablet (800 mg total) by mouth 3 (three) times daily. -     fexofenadine-pseudoephedrine (ALLEGRA-D 24) 180-240 MG 24 hr tablet; Take 1 tablet by mouth every evening. For allergy and congestion -     venlafaxine XR (EFFEXOR-XR) 75 MG 24 hr capsule; Take 3 capsules (225 mg total) by mouth daily. For night sweats -     tiZANidine (ZANAFLEX) 2 MG tablet; TAKE 1 TABLET 3 TIMES A DAY AS NEEDED FOR MUSCLE SPASMS.      I have changed Crystal [name]. Crystal [name]'s gabapentin and venlafaxine XR. I am also having her maintain her lidocaine, hyoscyamine, fexofenadine-pseudoephedrine, and tiZANidine.  Allergies as of 05/31/2021       Reactions   Reglan [metoclopramide] Other (See Comments)   Muscle spasms   Sulfa Antibiotics Anaphylaxis        Medication List        Accurate as of May 31, 2021 11:59 PM. If you have any questions, ask your nurse or doctor.          fexofenadine-pseudoephedrine 180-240 MG 24 hr tablet Commonly known as: ALLEGRA-D 24 Take 1 tablet by mouth every evening. For allergy and congestion   gabapentin 800 MG tablet Commonly known as: NEURONTIN Take 1 tablet (800 mg total) by mouth 3 (three) times daily. What changed: additional instructions Changed by: [name], MD   hyoscyamine 0.375 MG 12 hr tablet Commonly known as: Levbid Take 1 tablet (0.375 mg total) by mouth 2 (two) times daily. For stool regulation, abd cramping, etc.   lidocaine 5 % Commonly known as: Lidoderm Place 1 patch onto the skin daily. Remove & Discard patch within 12 hours or as directed by MD   tiZANidine 2 MG tablet Commonly known as: ZANAFLEX TAKE 1 TABLET 3 TIMES A DAY AS NEEDED FOR MUSCLE SPASMS.   venlafaxine XR 75 MG 24 hr capsule Commonly known as: EFFEXOR-XR Take 3 capsules (225 mg total) by mouth daily. For night sweats What changed:  medication strength how much to take additional instructions Changed by: [name], MD         Follow-up: Return in  about 6 months (around 12/01/2021).  [name], M.D.

## 2021-06-01 LAB — CBC WITH DIFFERENTIAL/PLATELET
Basophils Absolute: 0.1 10*3/uL (ref 0.0–0.2)
Basos: 1 %
EOS (ABSOLUTE): 0.2 10*3/uL (ref 0.0–0.4)
Eos: 2 %
Hematocrit: 41.3 % (ref 34.0–46.6)
Hemoglobin: 14 g/dL (ref 11.1–15.9)
Immature Grans (Abs): 0 10*3/uL (ref 0.0–0.1)
Immature Granulocytes: 0 %
Lymphocytes Absolute: 4.3 10*3/uL — ABNORMAL HIGH (ref 0.7–3.1)
Lymphs: 43 %
MCH: 28.6 pg (ref 26.6–33.0)
MCHC: 33.9 g/dL (ref 31.5–35.7)
MCV: 85 fL (ref 79–97)
Monocytes Absolute: 0.7 10*3/uL (ref 0.1–0.9)
Monocytes: 7 %
Neutrophils Absolute: 4.6 10*3/uL (ref 1.4–7.0)
Neutrophils: 47 %
Platelets: 291 10*3/uL (ref 150–450)
RBC: 4.89 x10E6/uL (ref 3.77–5.28)
RDW: 13 % (ref 11.7–15.4)
WBC: 9.9 10*3/uL (ref 3.4–10.8)

## 2021-06-01 LAB — CMP14+EGFR
ALT: 15 IU/L (ref 0–32)
AST: 19 IU/L (ref 0–40)
Albumin/Globulin Ratio: 2.3 — ABNORMAL HIGH (ref 1.2–2.2)
Albumin: 4.8 g/dL (ref 3.8–4.9)
Alkaline Phosphatase: 137 IU/L — ABNORMAL HIGH (ref 44–121)
BUN/Creatinine Ratio: 15 (ref 9–23)
BUN: 10 mg/dL (ref 6–24)
Bilirubin Total: 0.3 mg/dL (ref 0.0–1.2)
CO2: 24 mmol/L (ref 20–29)
Calcium: 9.9 mg/dL (ref 8.7–10.2)
Chloride: 102 mmol/L (ref 96–106)
Creatinine, Ser: 0.67 mg/dL (ref 0.57–1.00)
Globulin, Total: 2.1 g/dL (ref 1.5–4.5)
Glucose: 97 mg/dL (ref 65–99)
Potassium: 4.1 mmol/L (ref 3.5–5.2)
Sodium: 141 mmol/L (ref 134–144)
Total Protein: 6.9 g/dL (ref 6.0–8.5)
eGFR: 101 mL/min/{1.73_m2} (ref >59)

## 2021-06-04 ENCOUNTER — Encounter: Payer: Self-pay | Admitting: Family Medicine

## 2021-06-04 NOTE — Progress Notes (Signed)
Hello Kayse,  Your lab result is normal and/or stable.Some minor variations that are not significant are commonly marked abnormal, but do not represent any medical problem for you.  Best regards, Mikael Debell, M.D.

## 2021-06-14 ENCOUNTER — Other Ambulatory Visit: Payer: Self-pay | Admitting: Family Medicine

## 2021-06-14 ENCOUNTER — Telehealth: Payer: Self-pay | Admitting: Family Medicine

## 2021-06-14 ENCOUNTER — Other Ambulatory Visit: Payer: Self-pay | Admitting: *Deleted

## 2021-06-14 MED ORDER — HYOSCYAMINE SULFATE ER 0.375 MG PO TB12: 0.3750 mg | ORAL_TABLET | Freq: Two times a day (BID) | ORAL | 1 refills | Status: DC

## 2021-06-14 MED ORDER — HYOSCYAMINE SULFATE ER 0.375 MG PO TB12: 0.3750 mg | ORAL_TABLET | Freq: Two times a day (BID) | ORAL | 0 refills | Status: DC

## 2021-06-14 NOTE — Telephone Encounter (Signed)
Please let the patient know that I sent their prescription to their pharmacy. Thanks, WS 

## 2021-08-09 ENCOUNTER — Encounter: Payer: Self-pay | Admitting: Family Medicine

## 2021-08-09 ENCOUNTER — Ambulatory Visit: Payer: Commercial Managed Care - PPO | Admitting: Family Medicine

## 2021-08-09 DIAGNOSIS — R159 Full incontinence of feces: Secondary | ICD-10-CM | POA: Diagnosis not present

## 2021-08-09 NOTE — Progress Notes (Signed)
Subjective:  Patient ID: Crystal [name], female    DOB: [birth year]  Age: 60 y.o. MRN: [id number]  CC: No chief complaint on file.   HPI [name] presents for incontinent of stool. Persistent in spite of work up at [facility].  Concerned that Tail bone cysts removed years ago may have caused misalignment of tailbone that may have caused her fecal inconitinence.   She is constipated, then passes stool involuntarily. It is loose when it occurs. No sensation to go with it. "It's just there."   Depression screen [facility] 2/9 05/31/2021 05/22/2018 07/19/2017  Decreased Interest 0 0 0  Down, Depressed, Hopeless 0 0 0  PHQ - 2 Score 0 0 0  Altered sleeping - - -  Tired, decreased energy - - -  Change in appetite - - -  Feeling bad or failure about yourself  - - -  Trouble concentrating - - -  Moving slowly or fidgety/restless - - -  Suicidal thoughts - - -  PHQ-9 Score - - -    History [name] has a past medical history of IBS (irritable bowel syndrome) and Post herpetic neuralgia.   She has a past surgical history that includes Tonsillectomy and Eye muscle surgery.   Her family history includes Cancer (age of onset: 64) in her mother; Hypertension in her father.She reports that she quit smoking about 9 years ago. Her smoking use included cigarettes. She smoked an average of .5 packs per day. She has never used smokeless tobacco. She reports current alcohol use. She reports that she does not use drugs.    ROS Review of Systems  Constitutional:  Negative for activity change, appetite change and fever.  Gastrointestinal:  Positive for constipation. Negative for abdominal distention, abdominal pain, blood in stool, diarrhea, nausea, rectal pain and vomiting.  Genitourinary:  Negative for difficulty urinating and pelvic pain.  Neurological: Negative.    Objective:  There were no vitals taken for this visit.  BP Readings from Last 3 Encounters:  05/31/21 138/68  01/02/21 114/62   12/23/18 118/63    Wt Readings from Last 3 Encounters:  05/31/21 145 lb 6.4 oz (66 kg)  01/02/21 154 lb (69.9 kg)  12/23/18 145 lb 3.2 oz (65.9 kg)     Physical Exam  Exam deferred. Pt. Harboring due to COVID 19. Phone visit performed.   Assessment & Plan:   Diagnoses and all orders for this visit:  Incontinence of feces, unspecified fecal incontinence type      I am having [name]. Paglia maintain her lidocaine, gabapentin, fexofenadine-pseudoephedrine, venlafaxine XR, tiZANidine, and hyoscyamine.  Allergies as of 08/09/2021       Reactions   Reglan [metoclopramide] Other (See Comments)   Muscle spasms   Sulfa Antibiotics Anaphylaxis        Medication List        Accurate as of August 09, 2021  1:45 PM. If you have any questions, ask your nurse or doctor.          fexofenadine-pseudoephedrine 180-240 MG 24 hr tablet Commonly known as: ALLEGRA-D 24 Take 1 tablet by mouth every evening. For allergy and congestion   gabapentin 800 MG tablet Commonly known as: NEURONTIN Take 1 tablet (800 mg total) by mouth 3 (three) times daily.   hyoscyamine 0.375 MG 12 hr tablet Commonly known as: Levbid Take 1 tablet (0.375 mg total) by mouth 2 (two) times daily. For stool regulation, abd cramping, etc.   lidocaine 5 % Commonly  known as: Lidoderm Place 1 patch onto the skin daily. Remove & Discard patch within 12 hours or as directed by MD   tiZANidine 2 MG tablet Commonly known as: ZANAFLEX TAKE 1 TABLET 3 TIMES A DAY AS NEEDED FOR MUSCLE SPASMS.   venlafaxine XR 75 MG 24 hr capsule Commonly known as: EFFEXOR-XR Take 3 capsules (225 mg total) by mouth daily. For night sweats        I connected with CANDA PODGORSKI on 08/09/21  by telephone and verified that I am speaking with the correct person using two identifiers. [name] is currently located at work and  no one  is currently with her during visit. The provider, Mechele Claude, MD is located in  his office at time of visit.  I discussed the limitations, risks, security and privacy concerns of performing an evaluation and management service by telephone and the availability of in person appointments. I also discussed with the patient that there may be a patient responsible charge related to this service. The patient expressed understanding and agreed to proceed.       Assessment and Plan: 1. Incontinence of feces, unspecified fecal incontinence type    MRI Pelvis to be done at [facility]  Follow Up Instructions:     I discussed the assessment and treatment plan with the patient. The patient was provided an opportunity to ask questions and all were answered. The patient agreed with the plan and demonstrated an understanding of the instructions.   The patient was advised to call back or seek an in-person evaluation if the symptoms worsen or if the condition fails to improve as anticipated.  The above assessment and management plan was discussed with the patient. The patient verbalized understanding of and has agreed to the management plan. Patient is aware to call the clinic if symptoms persist or worsen. Patient is aware when to return to the clinic for a follow-up visit. Patient educated on when it is appropriate to go to the emergency department.    I provided 17 minutes of non-face-to-face time during this encounter.    Follow-up: Return in about 2 weeks (around 08/23/2021) for fecal incontinence. Mechele Claude, M.D.

## 2021-08-13 ENCOUNTER — Telehealth: Payer: Self-pay | Admitting: Family Medicine

## 2021-08-13 NOTE — Telephone Encounter (Signed)
Order written

## 2021-08-13 NOTE — Telephone Encounter (Signed)
Pt called stating that Dr Darlyn Read sent order for her to have MRI done at [facility] but says order was sent back to him because it needed diagnosis codes.  Please add diag codes to MRI order and fax back to [facility] ASAP.  Call patient to let her know once order has been faxed.

## 2021-08-14 NOTE — Telephone Encounter (Signed)
Order faxed.

## 2021-08-23 ENCOUNTER — Encounter: Payer: Self-pay | Admitting: Family Medicine

## 2021-08-23 ENCOUNTER — Telehealth: Payer: Self-pay | Admitting: Family Medicine

## 2021-08-23 DIAGNOSIS — Q058 Sacral spina bifida without hydrocephalus: Secondary | ICD-10-CM

## 2021-08-23 DIAGNOSIS — M5137 Other intervertebral disc degeneration, lumbosacral region: Secondary | ICD-10-CM

## 2021-08-23 NOTE — Telephone Encounter (Signed)
Spinal abnormalities at the lower spine may be contributing to a neurologic cause of her incontinence. I recommend she see a Insurance account manager at [facility] or [place].

## 2021-08-23 NOTE — Telephone Encounter (Signed)
Patient is requesting Dr Darlyn Read review MRI (available in Care Everywhere) and call her to discuss treatment moving forward.

## 2021-08-23 NOTE — Telephone Encounter (Signed)
Called patient, no answer, left message to return call 

## 2021-08-23 NOTE — Telephone Encounter (Signed)
REFERRAL REQUEST Telephone Note  Have you been seen at our office for this problem? yes (Advise that they may need an appointment with their PCP before a referral can be done)  Reason for Referral: pt just had mri of back and now she would like one for her neck. I offered appt for today but pt wanted to speak with nurse. She said something in her neck and back popped last night. Referral discussed with patient:  Best contact number of patient for referral team:   [phone] Has patient been seen by a specialist for this issue before: not sure Patient provider preference for referral: na Patient location preference for referral: na   Patient notified that referrals can take up to a week or longer to process. If they haven't heard anything within a week they should call back and speak with the referral department.

## 2021-08-28 NOTE — Telephone Encounter (Signed)
Patient is aware and referral has been made.

## 2021-09-12 ENCOUNTER — Telehealth: Payer: Self-pay | Admitting: Family Medicine

## 2021-09-12 NOTE — Telephone Encounter (Signed)
Order faxed 10/14 and again today 10/19. Patient aware

## 2021-09-12 NOTE — Telephone Encounter (Signed)
Pt calling to check on the status of her MRI order. Please call back and advise.

## 2021-09-13 NOTE — Telephone Encounter (Signed)
Pt called to let us know that the hospital still hasn't received fax for MRI order.   Needs to be faxed to [phone]

## 2021-09-13 NOTE — Telephone Encounter (Signed)
Order re-faxed, patient aware

## 2021-10-16 ENCOUNTER — Other Ambulatory Visit: Payer: Self-pay | Admitting: Family Medicine

## 2021-11-06 ENCOUNTER — Ambulatory Visit: Payer: Commercial Managed Care - PPO | Admitting: Family Medicine

## 2021-11-06 ENCOUNTER — Encounter: Payer: Self-pay | Admitting: Family Medicine

## 2021-11-06 VITALS — BP 117/66 | HR 66 | Temp 97.7°F | Ht 64.0 in | Wt 148.8 lb

## 2021-11-06 DIAGNOSIS — M47816 Spondylosis without myelopathy or radiculopathy, lumbar region: Secondary | ICD-10-CM

## 2021-11-06 DIAGNOSIS — M4134 Thoracogenic scoliosis, thoracic region: Secondary | ICD-10-CM

## 2021-11-06 DIAGNOSIS — R159 Full incontinence of feces: Secondary | ICD-10-CM | POA: Diagnosis not present

## 2021-11-06 DIAGNOSIS — M4712 Other spondylosis with myelopathy, cervical region: Secondary | ICD-10-CM

## 2021-11-06 MED ORDER — DICLOFENAC SODIUM 75 MG PO TBEC: 75.0000 mg | DELAYED_RELEASE_TABLET | Freq: Two times a day (BID) | ORAL | 2 refills | Status: DC

## 2021-11-06 NOTE — Progress Notes (Signed)
Subjective:  Patient ID: Crystal [name], female    DOB: [birth year]  Age: 60 y.o. MRN: [id number]  CC: No chief complaint on file.   HPI Crystal [name] presents for recheck of fecal incontinence. Occurs every few days. Soils herself. GI had her do Kegels. Not helping.  Had MRI of cervical, thoracic and Lumbar spine and has multilevel spondylosis with formainal narrowing C6-7 and L5- S1. Thoracic scoliosis noted.  Crystal [name], neurosurgeon looked at MRIs and said surgery wouldn't help her back.   Needs FMLA for fecal incontinence. May have to leave work suddenly or miss a day. Occurrences every 2 weeks possibly.   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 Limit lifting, pulling to occasional. And  may miss one day or two  a month.   Depression screen [facility] 2/9 11/06/2021 11/06/2021 05/31/2021  Decreased Interest 1 0 0  Down, Depressed, Hopeless 0 0 0  PHQ - 2 Score 1 0 0  Altered sleeping 0 - -  Tired, decreased energy 0 - -  Change in appetite 3 - -  Feeling bad or failure about yourself  0 - -  Trouble concentrating 0 - -  Moving slowly or fidgety/restless 0 - -  Suicidal thoughts 0 - -  PHQ-9 Score 4 - -  Difficult doing work/chores Not difficult at all - -    History Crystal [name] has a past medical history of IBS (irritable bowel syndrome) and Post herpetic neuralgia.   She has a past surgical history that includes Tonsillectomy and Eye muscle surgery.   Her family history includes Cancer (age of onset: 57) in her mother; Hypertension in her father.She reports that she quit smoking about 9 years ago. Her smoking use included cigarettes. She smoked an average of .5 packs per day. She has never used smokeless tobacco. She reports current alcohol use. She reports that she does not use drugs.    ROS Review of Systems  Constitutional:  Positive for activity change.  HENT: Negative.    Eyes:  Negative for visual disturbance.  Respiratory:  Negative for shortness of  breath.   Cardiovascular:  Negative for chest pain.  Gastrointestinal:  Negative for abdominal pain.  Musculoskeletal:  Negative for arthralgias.  Noncontributory except as in HPI Objective:  BP 117/66    Pulse 66    Temp 97.7 F (36.5 C)    Ht 5\' 4"  (1.626 m)    Wt 148 lb 12.8 oz (67.5 kg)    SpO2 96%    BMI 25.54 kg/m   BP Readings from Last 3 Encounters:  11/06/21 117/66  05/31/21 138/68  01/02/21 114/62    Wt Readings from Last 3 Encounters:  11/06/21 148 lb 12.8 oz (67.5 kg)  05/31/21 145 lb 6.4 oz (66 kg)  01/02/21 154 lb (69.9 kg)     Physical Exam Constitutional:      General: She is not in acute distress.    Appearance: Normal appearance. She is ill-appearing.  HENT:     Head: Normocephalic.  Neurological:     General: No focal deficit present.     Mental Status: She is alert.     Motor: No weakness.     Gait: Gait normal.  Psychiatric:        Mood and Affect: Mood normal.        Thought Content: Thought content normal.      Assessment & Plan:   Diagnoses and all orders for this visit:  Spondylosis  of cervical spine with myelopathy -     Ambulatory referral to Neurosurgery  Spondylosis of lumbar spine -     Ambulatory referral to Neurosurgery  Thoracogenic scoliosis of thoracic region -     Ambulatory referral to Neurosurgery  Incontinence of feces, unspecified fecal incontinence type  Frequent fecal incontinence  Other orders -     diclofenac (VOLTAREN) 75 MG EC tablet; Take 1 tablet (75 mg total) by mouth 2 (two) times daily. For muscle and  Joint pain      I am having Glenna Durand. Stephani start on diclofenac. I am also having her maintain her lidocaine, fexofenadine-pseudoephedrine, venlafaxine XR, tiZANidine, gabapentin, and hyoscyamine.  Allergies as of 11/06/2021       Reactions   Reglan [metoclopramide] Other (See Comments)   Muscle spasms   Sulfa Antibiotics Anaphylaxis        Medication List        Accurate as of November 06, 2021  8:07 PM. If you have any questions, ask your nurse or doctor.          diclofenac 75 MG EC tablet Commonly known as: VOLTAREN Take 1 tablet (75 mg total) by mouth 2 (two) times daily. For muscle and  Joint pain Started by: [name], MD   fexofenadine-pseudoephedrine 180-240 MG 24 hr tablet Commonly known as: ALLEGRA-D 24 Take 1 tablet by mouth every evening. For allergy and congestion   gabapentin 800 MG tablet Commonly known as: NEURONTIN TAKE 1 TABLET BY MOUTH 3 TIMES DAILY.   hyoscyamine 0.375 MG 12 hr tablet Commonly known as: LEVBID TAKE 1 TABLET TWICE A DAY FOR STOOL REGULATION ANDABDOMIMAL CRAMPING   lidocaine 5 % Commonly known as: Lidoderm Place 1 patch onto the skin daily. Remove & Discard patch within 12 hours or as directed by MD   tiZANidine 2 MG tablet Commonly known as: ZANAFLEX TAKE 1 TABLET 3 TIMES A DAY AS NEEDED FOR MUSCLE SPASMS.   venlafaxine XR 75 MG 24 hr capsule Commonly known as: EFFEXOR-XR Take 3 capsules (225 mg total) by mouth daily. For night sweats         Follow-up: No follow-ups on file.  [name], M.D.

## 2021-11-15 ENCOUNTER — Other Ambulatory Visit: Payer: Self-pay | Admitting: Family Medicine

## 2021-12-04 ENCOUNTER — Other Ambulatory Visit: Payer: Self-pay | Admitting: Family Medicine

## 2021-12-19 ENCOUNTER — Other Ambulatory Visit: Payer: Self-pay | Admitting: Family Medicine

## 2021-12-20 ENCOUNTER — Other Ambulatory Visit: Payer: Self-pay | Admitting: Family Medicine

## 2022-01-11 ENCOUNTER — Encounter: Payer: Self-pay | Admitting: Nurse Practitioner

## 2022-01-11 ENCOUNTER — Ambulatory Visit: Payer: Commercial Managed Care - PPO | Admitting: Nurse Practitioner

## 2022-01-11 ENCOUNTER — Encounter: Payer: Self-pay | Admitting: General Practice

## 2022-01-11 VITALS — BP 128/66 | HR 60 | Temp 98.4°F | Ht 64.0 in | Wt 147.0 lb

## 2022-01-11 DIAGNOSIS — H9203 Otalgia, bilateral: Secondary | ICD-10-CM | POA: Diagnosis not present

## 2022-01-11 MED ORDER — AMOXICILLIN-POT CLAVULANATE 875-125 MG PO TABS
1.0000 | ORAL_TABLET | Freq: Two times a day (BID) | ORAL | 0 refills | Status: DC
Start: 2022-01-11 — End: 2022-01-25

## 2022-01-11 NOTE — Patient Instructions (Signed)
Earache, Adult An earache, or ear pain, can be caused by many things, including: An infection. Ear wax buildup. Ear pressure. Something in the ear that should not be there (foreign body). A sore throat. Tooth problems. Jaw problems. Treatment of the earache will depend on the cause. If the cause is not clear or cannot be determined, you may need to watch your symptoms until your earache goes away or until a cause is found. Follow these instructions at home: Medicines Take or apply over-the-counter and prescription medicines only as told by your health care provider. If you were prescribed an antibiotic medicine, use it as told by your health care provider. Do not stop using the antibiotic even if you start to feel better. Do not put anything in your ear other than medicine that is prescribed by your health care provider. Managing pain If directed, apply heat to the affected area as often as told by your health care provider. Use the heat source that your health care provider recommends, such as a moist heat pack or a heating pad. Place a towel between your skin and the heat source. Leave the heat on for 20-30 minutes. Remove the heat if your skin turns bright red. This is especially important if you are unable to feel pain, heat, or cold. You may have a greater risk of getting burned. If directed, put ice on the affected area as often as told by your health care provider. To do this:   Put ice in a plastic bag. Place a towel between your skin and the bag. Leave the ice on for 20 minutes, 2-3 times a day. General instructions Pay attention to any changes in your symptoms. Try resting in an upright position instead of lying down. This may help to reduce pressure in your ear and relieve pain. Chew gum if it helps to relieve your ear pain. Treat any allergies as told by your health care provider. Drink enough fluid to keep your urine pale yellow. It is up to you to get the results of any  tests that were done. Ask your health care provider, or the department that is doing the tests, when your results will be ready. Keep all follow-up visits as told by your health care provider. This is important. Contact a health care provider if: Your pain does not improve within 2 days. Your earache gets worse. You have new symptoms. You have a fever. Get help right away if you: Have a severe headache. Have a stiff neck. Have trouble swallowing. Have redness or swelling behind your ear. Have fluid or blood coming from your ear. Have hearing loss. Feel dizzy. Summary An earache, or ear pain, can be caused by many things. Treatment of the earache will depend on the cause. Follow recommendations from your health care provider to treat your ear pain. If the cause is not clear or cannot be determined, you may need to watch your symptoms until your earache goes away or until a cause is found. Keep all follow-up visits as told by your health care provider. This is important. This information is not intended to replace advice given to you by your health care provider. Make sure you discuss any questions you have with your health care provider. Document Revised: 06/19/2019 Document Reviewed: 06/19/2019 Elsevier Patient Education  2022 Elsevier Inc.  

## 2022-01-11 NOTE — Progress Notes (Signed)
Acute Office Visit  Subjective:    Patient ID: Crystal [name], female    DOB: 06/28/1961, 61 y.o.   MRN: [id number]  Chief Complaint  Patient presents with   Ear Fullness    Bilateral ear fullness with pain and vertigo    Ear Fullness  There is pain in both ears. This is a new problem. The current episode started in the past 7 days. The problem occurs constantly. The problem has been unchanged. There has been no fever. The pain is moderate. Associated symptoms include ear discharge. Pertinent negatives include no coughing, headaches, hearing loss, rhinorrhea, sore throat or vomiting. Associated symptoms comments: Dizziness, ear fullness.    Past Medical History:  Diagnosis Date   IBS (irritable bowel syndrome)    Post herpetic neuralgia     Past Surgical History:  Procedure Laterality Date   EYE MUSCLE SURGERY     TONSILLECTOMY      Family History  Problem Relation Age of Onset   Cancer Mother 52       colon   Hypertension Father     Social History   Socioeconomic History   Marital status: Married    Spouse name: Not on file   Number of children: Not on file   Years of education: Not on file   Highest education level: Not on file  Occupational History   Not on file  Tobacco Use   Smoking status: Former    Packs/day: 0.50    Types: Cigarettes    Quit date: 05/02/2012    Years since quitting: 9.7   Smokeless tobacco: Never  Vaping Use   Vaping Use: Never used  Substance and Sexual Activity   Alcohol use: Yes    Alcohol/week: 0.0 standard drinks    Comment: rarely   Drug use: No   Sexual activity: Yes    Birth control/protection: Post-menopausal  Other Topics Concern   Not on file  Social History Narrative   Not on file   Social Determinants of Health   Financial Resource Strain: Not on file  Food Insecurity: Not on file  Transportation Needs: Not on file  Physical Activity: Not on file  Stress: Not on file  Social Connections: Not on file   Intimate Partner Violence: Not on file    Outpatient Medications Prior to Visit  Medication Sig Dispense Refill   diclofenac (VOLTAREN) 75 MG EC tablet Take 1 tablet (75 mg total) by mouth 2 (two) times daily. For muscle and  Joint pain 60 tablet 2   fexofenadine-pseudoephedrine (ALLEGRA-D 24) 180-240 MG 24 hr tablet Take 1 tablet by mouth every evening. For allergy and congestion 90 tablet 3   gabapentin (NEURONTIN) 800 MG tablet TAKE 1 TABLET BY MOUTH 3 TIMES DAILY. 90 tablet 2   hyoscyamine (LEVBID) 0.375 MG 12 hr tablet TAKE 1 TABLET TWICE A DAY FOR STOOL REGULATION AND ABDOMIMAL CRAMPING 60 tablet 1   lidocaine (LIDODERM) 5 % Place 1 patch onto the skin daily. Remove & Discard patch within 12 hours or as directed by MD 30 patch 3   tiZANidine (ZANAFLEX) 2 MG tablet TAKE 1 TABLET 3 TIMES A DAY AS NEEDED FOR MUSCLE SPASMS. 90 tablet 1   venlafaxine XR (EFFEXOR-XR) 75 MG 24 hr capsule TAKE 3 CAPSULES ONCE DAILY. 270 capsule 0   No facility-administered medications prior to visit.    Allergies  Allergen Reactions   Reglan [Metoclopramide] Other (See Comments)    Muscle spasms   Sulfa Antibiotics  Anaphylaxis    Review of Systems  Constitutional: Negative.   HENT:  Positive for ear discharge and ear pain. Negative for congestion, hearing loss, postnasal drip, rhinorrhea, sinus pressure, sneezing and sore throat.   Respiratory:  Negative for cough.   Gastrointestinal:  Negative for vomiting.  Neurological:  Negative for headaches.  All other systems reviewed and are negative.     Objective:    Physical Exam Vitals and nursing note reviewed.  Constitutional:      Appearance: Normal appearance.  HENT:     Head: Normocephalic and atraumatic.     Right Ear: External ear normal. No decreased hearing noted. Drainage and tenderness present. No swelling. There is no impacted cerumen. No foreign body.     Left Ear: External ear normal. Tenderness present. No swelling. There is no  impacted cerumen. No foreign body.     Nose: Nose normal. No congestion.     Mouth/Throat:     Mouth: Mucous membranes are moist.  Eyes:     Conjunctiva/sclera: Conjunctivae normal.  Pulmonary:     Breath sounds: Normal breath sounds.  Skin:    General: Skin is warm.     Findings: No rash.  Neurological:     Mental Status: She is alert and oriented to person, place, and time.  Psychiatric:        Mood and Affect: Mood normal.        Behavior: Behavior normal.    BP 128/66    Pulse 60    Temp 98.4 F (36.9 C)    Ht '5\' 4"'  (1.626 m)    Wt 147 lb (66.7 kg)    SpO2 97%    BMI 25.23 kg/m  Wt Readings from Last 3 Encounters:  01/11/22 147 lb (66.7 kg)  11/06/21 148 lb 12.8 oz (67.5 kg)  05/31/21 145 lb 6.4 oz (66 kg)    Health Maintenance Due  Topic Date Due   HIV Screening  Never done   Hepatitis C Screening  Never done   PAP SMEAR-Modifier  Never done   Zoster Vaccines- Shingrix (1 of 2) Never done   MAMMOGRAM  04/23/2018   COVID-19 Vaccine (3 - Booster for Moderna series) 02/16/2020   INFLUENZA VACCINE  06/25/2021    There are no preventive care reminders to display for this patient.   Lab Results  Component Value Date   TSH 1.610 05/22/2018   Lab Results  Component Value Date   WBC 9.9 05/31/2021   HGB 14.0 05/31/2021   HCT 41.3 05/31/2021   MCV 85 05/31/2021   PLT 291 05/31/2021   Lab Results  Component Value Date   NA 141 05/31/2021   K 4.1 05/31/2021   CO2 24 05/31/2021   GLUCOSE 97 05/31/2021   BUN 10 05/31/2021   CREATININE 0.67 05/31/2021   BILITOT 0.3 05/31/2021   ALKPHOS 137 (H) 05/31/2021   AST 19 05/31/2021   ALT 15 05/31/2021   PROT 6.9 05/31/2021   ALBUMIN 4.8 05/31/2021   CALCIUM 9.9 05/31/2021   EGFR 101 05/31/2021   Lab Results  Component Value Date   CHOL 203 (H) 05/22/2018   Lab Results  Component Value Date   HDL 46 05/22/2018   Lab Results  Component Value Date   LDLCALC 130 (H) 05/22/2018   Lab Results  Component  Value Date   TRIG 136 05/22/2018   Lab Results  Component Value Date   CHOLHDL 4.4 05/22/2018   No results found for: HGBA1C  Assessment & Plan:  Ear canal red and tender, feelings of fullness Dizziness still present. -Tylenol/ibuprofen for pain -Augmentin 875-125 mg tablet by mouth -Increase hydration -Patient knows to follow-up for worsening or unresolved symptoms Problem List Items Addressed This Visit   None Visit Diagnoses     Otalgia of both ears    -  Primary        Meds ordered this encounter  Medications   amoxicillin-clavulanate (AUGMENTIN) 875-125 MG tablet    Sig: Take 1 tablet by mouth 2 (two) times daily.    Dispense:  10 tablet    Refill:  0    Order Specific Question:   Supervising Provider    Answer:   Jeneen Rinks     Ivy Lynn, NP

## 2022-01-25 ENCOUNTER — Ambulatory Visit: Payer: Commercial Managed Care - PPO | Admitting: Family Medicine

## 2022-01-25 ENCOUNTER — Encounter: Payer: Self-pay | Admitting: Family Medicine

## 2022-01-25 VITALS — BP 126/76 | HR 62 | Ht 64.0 in | Wt 147.0 lb

## 2022-01-25 DIAGNOSIS — M47816 Spondylosis without myelopathy or radiculopathy, lumbar region: Secondary | ICD-10-CM

## 2022-01-25 MED ORDER — METHYLPREDNISOLONE ACETATE 40 MG/ML IJ SUSP
80.0000 mg | Freq: Once | INTRAMUSCULAR | Status: AC
  Administered 2022-01-25: 80 mg via INTRAMUSCULAR

## 2022-01-25 NOTE — Progress Notes (Signed)
? ?BP 126/76   Pulse 62   Ht 5\' 4"  (1.626 m)   Wt 147 lb (66.7 kg)   SpO2 97%   BMI 25.23 kg/m?   ? ?Subjective:  ? ?Patient ID: Crystal [name], female    DOB: 03-24-1961, 61 y.o.   MRN: [id number] ? ?HPI: ?Crystal [name] is a 61 y.o. female presenting on 01/25/2022 for Back Pain (Lower back- h/o arthritis and fecal incontinence) ? ? ?HPI ?Low back pain ?Patient is coming in with low back pain.  She this has been chronic and she does have chronic degenerative disc disease that is known.  She has seen specialist for this and it has been stable but she says just over the past week it has flared up and causing her pain.  The pain is in her midline lower back near her belt line and across to both sides.  She denies any new numbness or weakness.  She does complain of some fecal incontinence but that has been chronic and not new. ? ?Relevant past medical, surgical, family and social history reviewed and updated as indicated. Interim medical history since our last visit reviewed. ?Allergies and medications reviewed and updated. ? ?Review of Systems  ?Constitutional:  Negative for chills and fever.  ?Eyes:  Negative for visual disturbance.  ?Respiratory:  Negative for chest tightness and shortness of breath.   ?Cardiovascular:  Negative for chest pain and leg swelling.  ?Musculoskeletal:  Positive for arthralgias, back pain and myalgias. Negative for gait problem and joint swelling.  ?Skin:  Negative for rash.  ?Neurological:  Negative for light-headedness and headaches.  ?Psychiatric/Behavioral:  Negative for agitation and behavioral problems.   ?All other systems reviewed and are negative. ? ?Per HPI unless specifically indicated above ? ? ?Allergies as of 01/25/2022   ? ?   Reactions  ? Reglan [metoclopramide] Other (See Comments)  ? Muscle spasms  ? Sulfa Antibiotics Anaphylaxis  ? ?  ? ?  ?Medication List  ?  ? ?  ? Accurate as of January 25, 2022  2:19 PM. If you have any questions, ask your nurse or doctor.  ?  ?  ? ?   ? ?STOP taking these medications   ? ?amoxicillin-clavulanate 875-125 MG tablet ?Commonly known as: AUGMENTIN ?Stopped by: Worthy Rancher, MD ?  ? ?  ? ?TAKE these medications   ? ?diclofenac 75 MG EC tablet ?Commonly known as: VOLTAREN ?Take 1 tablet (75 mg total) by mouth 2 (two) times daily. For muscle and  Joint pain ?  ?fexofenadine-pseudoephedrine 180-240 MG 24 hr tablet ?Commonly known as: ALLEGRA-D 24 ?Take 1 tablet by mouth every evening. For allergy and congestion ?  ?gabapentin 800 MG tablet ?Commonly known as: NEURONTIN ?TAKE 1 TABLET BY MOUTH 3 TIMES DAILY. ?  ?hyoscyamine 0.375 MG 12 hr tablet ?Commonly known as: LEVBID ?TAKE 1 TABLET TWICE A DAY FOR STOOL REGULATION AND ABDOMIMAL CRAMPING ?  ?lidocaine 5 % ?Commonly known as: Lidoderm ?Place 1 patch onto the skin daily. Remove & Discard patch within 12 hours or as directed by MD ?  ?tiZANidine 2 MG tablet ?Commonly known as: ZANAFLEX ?TAKE 1 TABLET 3 TIMES A DAY AS NEEDED FOR MUSCLE SPASMS. ?  ?venlafaxine XR 75 MG 24 hr capsule ?Commonly known as: EFFEXOR-XR ?TAKE 3 CAPSULES ONCE DAILY. ?  ? ?  ? ? ? ?Objective:  ? ?BP 126/76   Pulse 62   Ht 5\' 4"  (1.626 m)   Wt 147 lb (66.7  kg)   SpO2 97%   BMI 25.23 kg/m?   ?Wt Readings from Last 3 Encounters:  ?01/25/22 147 lb (66.7 kg)  ?01/11/22 147 lb (66.7 kg)  ?11/06/21 148 lb 12.8 oz (67.5 kg)  ?  ?Physical Exam ?Vitals and nursing note reviewed.  ?Constitutional:   ?   General: She is not in acute distress. ?   Appearance: She is well-developed. She is not diaphoretic.  ?Eyes:  ?   Conjunctiva/sclera: Conjunctivae normal.  ?Musculoskeletal:     ?   General: Normal range of motion.  ?   Lumbar back: Tenderness present. No bony tenderness. Negative right straight leg raise test and negative left straight leg raise test.  ?   Comments: Lower back hurts with pain in all range of motion.  ?Skin: ?   General: Skin is warm and dry.  ?   Findings: No rash.  ?Neurological:  ?   Mental Status: She is alert  and oriented to person, place, and time.  ?   Coordination: Coordination normal.  ?Psychiatric:     ?   Behavior: Behavior normal.  ? ? ? ? ?Assessment & Plan:  ? ?Problem List Items Addressed This Visit   ?None ?Visit Diagnoses   ? ? Spondylosis of lumbar spine    -  Primary  ? Relevant Medications  ? methylPREDNISolone acetate (DEPO-MEDROL) injection 80 mg  ? ?  ?  ?We will give steroid injection to see if we can calm this flare down.  If not she can follow-up if not improved. ?Follow up plan: ?Return if symptoms worsen or fail to improve. ? ?Counseling provided for all of the vaccine components ?No orders of the defined types were placed in this encounter. ? ? ?Caryl Pina, MD ?[place] ?01/25/2022, 2:19 PM ? ? ? ? ?

## 2022-02-07 ENCOUNTER — Other Ambulatory Visit: Payer: Self-pay | Admitting: Family Medicine

## 2022-02-12 ENCOUNTER — Ambulatory Visit: Payer: Commercial Managed Care - PPO | Admitting: Family Medicine

## 2022-02-12 ENCOUNTER — Encounter: Payer: Self-pay | Admitting: Family Medicine

## 2022-02-12 VITALS — BP 120/73 | HR 62 | Temp 96.9°F | Resp 20 | Ht 64.0 in | Wt 146.0 lb

## 2022-02-12 DIAGNOSIS — W19XXXA Unspecified fall, initial encounter: Secondary | ICD-10-CM

## 2022-02-12 DIAGNOSIS — M25551 Pain in right hip: Secondary | ICD-10-CM | POA: Diagnosis not present

## 2022-02-12 NOTE — Patient Instructions (Signed)
Dr Darlyn Read sent in the muscle relaxer on Friday.  That should be available for pick up for you. ?I have ordered an xray if anything gets worse, come get it. ? ?Contusion ?A contusion is a deep bruise. This is a result of an injury that causes bleeding under the skin. Symptoms of bruising include pain, swelling, and discolored skin. The skin may turn blue, purple, or yellow. ?Follow these instructions at home: ?Managing pain, stiffness, and swelling ?You may use RICE. This stands for: ?Resting. ?Icing. ?Compression, or putting pressure. ?Elevating, or raising the injured area. ?To follow this method, do these actions: ?Rest the injured area. ?If told, put ice on the injured area. ?Put ice in a plastic bag. ?Place a towel between your skin and the bag. ?Leave the ice on for 20 minutes, 2-3 times per day. ?If told, put light pressure (compression) on the injured area using an elastic bandage. Make sure the bandage is not too tight. If the area tingles or becomes numb, remove it and put it back on as told by your doctor. ?If possible, raise (elevate) the injured area above the level of your heart while you are sitting or lying down. ? ?General instructions ?Take over-the-counter and prescription medicines only as told by your doctor. ?Keep all follow-up visits as told by your doctor. This is important. ?Contact a doctor if: ?Your symptoms do not get better after several days of treatment. ?Your symptoms get worse. ?You have trouble moving the injured area. ?Get help right away if: ?You have very bad pain. ?You have a loss of feeling (numbness) in a hand or foot. ?Your hand or foot turns pale or cold. ?Summary ?A contusion is a deep bruise. This is a result of an injury that causes bleeding under the skin. ?Symptoms of bruising include pain, swelling, and discolored skin. The skin may turn blue, purple, or yellow. ?This condition is treated with rest, ice, compression, and elevation. This is also called RICE. You may be  given over-the-counter medicines for pain. ?Contact a doctor if you do not feel better, or you feel worse. Get help right away if you have very bad pain, have lost feeling in a hand or foot, or the area turns pale or cold. ?This information is not intended to replace advice given to you by your health care provider. Make sure you discuss any questions you have with your health care provider. ?Document Revised: 07/03/2018 Document Reviewed: 07/03/2018 ?Elsevier Patient Education ? 2022 Elsevier Inc. ? ?

## 2022-02-12 NOTE — Progress Notes (Signed)
? ?Subjective: ?CC: Right hip injury ?PCP: Mechele Claude, MD ?YKD:XIPJAS R Becka is a 61 y.o. female presenting to clinic today for: ? ?1.  Right hip injury ?Patient fell out of bed and injured her right hip.  She reports that she injured herself on Friday.  Saturday was okay but all of a sudden on Sunday it started and quite a bit worse.  She is walking okay but when she sits or tries to lean on that side or even bend it started hurting.  She denies any popping or locking.  No swelling or bruising.  She is using heat, ice, ibuprofen and diclofenac.  Needs a refill on her muscle relaxer. ? ? ?ROS: Per HPI ? ?Allergies  ?Allergen Reactions  ? Reglan [Metoclopramide] Other (See Comments)  ?  Muscle spasms  ? Sulfa Antibiotics Anaphylaxis  ? ?Past Medical History:  ?Diagnosis Date  ? IBS (irritable bowel syndrome)   ? Post herpetic neuralgia   ? ? ?Current Outpatient Medications:  ?  diclofenac (VOLTAREN) 75 MG EC tablet, Take 1 tablet (75 mg total) by mouth 2 (two) times daily. For muscle and  Joint pain, Disp: 60 tablet, Rfl: 2 ?  fexofenadine-pseudoephedrine (ALLEGRA-D 24) 180-240 MG 24 hr tablet, Take 1 tablet by mouth every evening. For allergy and congestion, Disp: 90 tablet, Rfl: 3 ?  gabapentin (NEURONTIN) 800 MG tablet, TAKE 1 TABLET BY MOUTH 3 TIMES DAILY., Disp: 90 tablet, Rfl: 2 ?  hyoscyamine (LEVBID) 0.375 MG 12 hr tablet, TAKE 1 TABLET TWICE A DAY FOR STOOL REGULATION AND ABDOMIMAL CRAMPING, Disp: 60 tablet, Rfl: 1 ?  lidocaine (LIDODERM) 5 %, Place 1 patch onto the skin daily. Remove & Discard patch within 12 hours or as directed by MD, Disp: 30 patch, Rfl: 3 ?  tiZANidine (ZANAFLEX) 2 MG tablet, TAKE 1 TABLET BY MOUTH THREE TIMES DAILY AS NEEDED FOR MUSCLE SPASMS., Disp: 90 tablet, Rfl: 0 ?  venlafaxine XR (EFFEXOR-XR) 75 MG 24 hr capsule, TAKE 3 CAPSULES ONCE DAILY., Disp: 270 capsule, Rfl: 0 ?Social History  ? ?Socioeconomic History  ? Marital status: Married  ?  Spouse name: Not on file  ? Number  of children: Not on file  ? Years of education: Not on file  ? Highest education level: Not on file  ?Occupational History  ? Not on file  ?Tobacco Use  ? Smoking status: Former  ?  Packs/day: 0.50  ?  Types: Cigarettes  ?  Quit date: 05/02/2012  ?  Years since quitting: 9.7  ? Smokeless tobacco: Never  ?Vaping Use  ? Vaping Use: Never used  ?Substance and Sexual Activity  ? Alcohol use: Yes  ?  Alcohol/week: 0.0 standard drinks  ?  Comment: rarely  ? Drug use: No  ? Sexual activity: Yes  ?  Birth control/protection: Post-menopausal  ?Other Topics Concern  ? Not on file  ?Social History Narrative  ? Not on file  ? ?Social Determinants of Health  ? ?Financial Resource Strain: Not on file  ?Food Insecurity: Not on file  ?Transportation Needs: Not on file  ?Physical Activity: Not on file  ?Stress: Not on file  ?Social Connections: Not on file  ?Intimate Partner Violence: Not on file  ? ?Family History  ?Problem Relation Age of Onset  ? Cancer Mother 67  ?     colon  ? Hypertension Father   ? ? ?Objective: ?Office vital signs reviewed. ?BP 120/73   Pulse 62   Temp (!) 96.9 ?F (  36.1 ?C)   Resp 20   Ht 5\' 4"  (1.626 m)   Wt 146 lb (66.2 kg)   SpO2 96%   BMI 25.06 kg/m?  ? ?Physical Examination:  ?General: Awake, alert, nontoxic, No acute distress ?MSK: normal gait and station ? Right hip: Atraumatic.  No soft tissue swelling.  No palpable bony defects.  Very mild tenderness to palpation along the trochanteric bursa but otherwise exam is unremarkable. ?Skin: No ecchymosis or erythema ? ?Assessment/ Plan: ?61 y.o. female  ? ?Acute hip pain, right - Plan: DG Hip Unilat W OR W/O Pelvis 2-3 Views Right ? ?Fall, initial encounter - Plan: DG Hip Unilat W OR W/O Pelvis 2-3 Views Right ? ?Sound like a contusion.  I ordered plain films if symptoms worsen for any reason if she develops any red flag signs or symptoms.  However, do not need to get these done today.  She understands indications for this imaging study.  Advised  against use of ibuprofen and diclofenac.  May use NSAID and Tylenol but certainly not to NSAIDs.  She voiced good understanding.  She may follow-up as needed ? ?No orders of the defined types were placed in this encounter. ? ?No orders of the defined types were placed in this encounter. ? ? ? ?68, DO ?[facility] ?[phone] ? ?

## 2022-02-15 ENCOUNTER — Other Ambulatory Visit: Payer: Self-pay | Admitting: Family Medicine

## 2022-02-25 ENCOUNTER — Other Ambulatory Visit: Payer: Self-pay | Admitting: Family Medicine

## 2022-03-04 ENCOUNTER — Encounter: Payer: Self-pay | Admitting: Family Medicine

## 2022-03-04 ENCOUNTER — Ambulatory Visit: Payer: Commercial Managed Care - PPO | Admitting: Family Medicine

## 2022-03-04 VITALS — BP 183/96 | HR 58 | Temp 97.2°F | Ht 64.0 in | Wt 141.4 lb

## 2022-03-04 DIAGNOSIS — A084 Viral intestinal infection, unspecified: Secondary | ICD-10-CM

## 2022-03-04 DIAGNOSIS — I10 Essential (primary) hypertension: Secondary | ICD-10-CM

## 2022-03-04 MED ORDER — ONDANSETRON HCL 4 MG PO TABS: 4.0000 mg | ORAL_TABLET | Freq: Three times a day (TID) | ORAL | 0 refills | Status: DC | PRN

## 2022-03-04 MED ORDER — AMLODIPINE BESYLATE 5 MG PO TABS: 5.0000 mg | ORAL_TABLET | Freq: Every day | ORAL | 0 refills | Status: DC

## 2022-03-04 NOTE — Patient Instructions (Signed)
Diarrhea, Adult ?Diarrhea is frequent loose and watery bowel movements. Diarrhea can make you feel weak and cause you to become dehydrated. Dehydration can make you tired and thirsty, cause you to have a dry mouth, and decrease how often you urinate. ?Diarrhea typically lasts 2-3 days. However, it can last longer if it is a sign of something more serious. It is important to treat your diarrhea as told by your health care provider. ?Follow these instructions at home: ?Eating and drinking ?  ?Follow these recommendations as told by your health care provider: ?Take an oral rehydration solution (ORS). This is an over-the-counter medicine that helps return your body to its normal balance of nutrients and water. It is found at pharmacies and retail stores. ?Drink plenty of fluids, such as water, ice chips, diluted fruit juice, and low-calorie sports drinks. You can drink milk also, if desired. ?Avoid drinking fluids that contain a lot of sugar or caffeine, such as energy drinks, sports drinks, and soda. ?Eat bland, easy-to-digest foods in small amounts as you are able. These foods include bananas, applesauce, rice, lean meats, toast, and crackers. ?Avoid alcohol. ?Avoid spicy or fatty foods. ? ?Medicines ?Take over-the-counter and prescription medicines only as told by your health care provider. ?If you were prescribed an antibiotic medicine, take it as told by your health care provider. Do not stop using the antibiotic even if you start to feel better. ?General instructions ? ?Wash your hands often using soap and water. If soap and water are not available, use a hand sanitizer. Others in the household should wash their hands as well. Hands should be washed: ?After using the toilet or changing a diaper. ?Before preparing, cooking, or serving food. ?While caring for a sick person or while visiting someone in a hospital. ?Drink enough fluid to keep your urine pale yellow. ?Rest at home while you recover. ?Watch your  condition for any changes. ?Take a warm bath to relieve any burning or pain from frequent diarrhea episodes. ?Keep all follow-up visits as told by your health care provider. This is important. ?Contact a health care provider if: ?You have a fever. ?Your diarrhea gets worse. ?You have new symptoms. ?You cannot keep fluids down. ?You feel light-headed or dizzy. ?You have a headache. ?You have muscle cramps. ?Get help right away if: ?You have chest pain. ?You feel extremely weak or you faint. ?You have bloody or black stools or stools that look like tar. ?You have severe pain, cramping, or bloating in your abdomen. ?You have trouble breathing or you are breathing very quickly. ?Your heart is beating very quickly. ?Your skin feels cold and clammy. ?You feel confused. ?You have signs of dehydration, such as: ?Dark urine, very little urine, or no urine. ?Cracked lips. ?Dry mouth. ?Sunken eyes. ?Sleepiness. ?Weakness. ?Summary ?Diarrhea is frequent loose and sometimes watery bowel movements. Diarrhea can make you feel weak and cause you to become dehydrated. ?Drink enough fluids to keep your urine pale yellow. ?Make sure that you wash your hands after using the toilet. If soap and water are not available, use hand sanitizer. ?Contact a health care provider if your diarrhea gets worse or you have new symptoms. ?Get help right away if you have signs of dehydration. ?This information is not intended to replace advice given to you by your health care provider. Make sure you discuss any questions you have with your health care provider. ?Document Revised: 05/23/2021 Document Reviewed: 05/23/2021 ?Elsevier Patient Education ? 2022 Elsevier Inc. ? ?

## 2022-03-04 NOTE — Progress Notes (Signed)
? ?Acute Office Visit ? ?Subjective:  ? ? Patient ID: Crystal [name], female    DOB: [birth year], 61 y.o.   MRN: [id number] ? ?Chief Complaint  ?Patient presents with  ? Anorexia  ?  Since the stomach bug started x 6 days ago. Last vomiting was Friday   ? Fatigue  ? Nausea  ? ? ?HPI ?Patient is in today for vomiting and diarrhea for 6 days. She reports that she has diarrhea after anytime that she eats. Reports loose stools, denies blood or mucus in stools. She has not had any vomiting in 3 days. She does reports intermittent abdominal cramping that is generalized. She reports chills and fatigue. She denies fever. She does also report a cough and decreased appetite. She has been staying well hydrated. She does have a history of IBS so she has not taken anything for the loose stool as she is afraid to end up with constipation. She denies sick contacts. ? ?She denies a history of hypertension and has never taken medication for her blood pressure.  ? ?BP Readings from Last 3 Encounters:  ?03/04/22 (!) 183/96  ?02/12/22 120/73  ?01/25/22 126/76  ? ? ? ?Past Medical History:  ?Diagnosis Date  ? IBS (irritable bowel syndrome)   ? Post herpetic neuralgia   ? ? ?Past Surgical History:  ?Procedure Laterality Date  ? EYE MUSCLE SURGERY    ? TONSILLECTOMY    ? ? ?Family History  ?Problem Relation Age of Onset  ? Cancer Mother 36  ?     colon  ? Hypertension Father   ? ? ?Social History  ? ?Socioeconomic History  ? Marital status: Married  ?  Spouse name: Not on file  ? Number of children: Not on file  ? Years of education: Not on file  ? Highest education level: Not on file  ?Occupational History  ? Not on file  ?Tobacco Use  ? Smoking status: Former  ?  Packs/day: 0.50  ?  Types: Cigarettes  ?  Quit date: 05/02/2012  ?  Years since quitting: 9.8  ? Smokeless tobacco: Never  ?Vaping Use  ? Vaping Use: Never used  ?Substance and Sexual Activity  ? Alcohol use: Yes  ?  Alcohol/week: 0.0 standard drinks  ?  Comment: rarely  ? Drug  use: No  ? Sexual activity: Yes  ?  Birth control/protection: Post-menopausal  ?Other Topics Concern  ? Not on file  ?Social History Narrative  ? Not on file  ? ?Social Determinants of Health  ? ?Financial Resource Strain: Not on file  ?Food Insecurity: Not on file  ?Transportation Needs: Not on file  ?Physical Activity: Not on file  ?Stress: Not on file  ?Social Connections: Not on file  ?Intimate Partner Violence: Not on file  ? ? ?Outpatient Medications Prior to Visit  ?Medication Sig Dispense Refill  ? diclofenac (VOLTAREN) 75 MG EC tablet TAKE 1 TABLET TWICE DAILY FOR MUSCLE AND JOINT PAIN. 60 tablet 1  ? fexofenadine-pseudoephedrine (ALLEGRA-D 24) 180-240 MG 24 hr tablet Take 1 tablet by mouth every evening. For allergy and congestion 90 tablet 3  ? gabapentin (NEURONTIN) 800 MG tablet TAKE 1 TABLET BY MOUTH 3 TIMES DAILY. 90 tablet 2  ? hyoscyamine (LEVBID) 0.375 MG 12 hr tablet TAKE 1 TABLET TWICE A DAY FOR STOOL REGULATION AND ABDOMIMAL CRAMPING 60 tablet 1  ? lidocaine (LIDODERM) 5 % Place 1 patch onto the skin daily. Remove & Discard patch within 12 hours or as  directed by MD 30 patch 3  ? tiZANidine (ZANAFLEX) 2 MG tablet TAKE 1 TABLET BY MOUTH THREE TIMES DAILY AS NEEDED FOR MUSCLE SPASMS. 90 tablet 0  ? venlafaxine XR (EFFEXOR-XR) 75 MG 24 hr capsule TAKE 3 CAPSULES ONCE DAILY. 270 capsule 0  ? ?No facility-administered medications prior to visit.  ? ? ?Allergies  ?Allergen Reactions  ? Reglan [Metoclopramide] Other (See Comments)  ?  Muscle spasms  ? Sulfa Antibiotics Anaphylaxis  ? ? ?Review of Systems  ?Constitutional:  Negative for diaphoresis and fever.  ?Eyes:  Negative for visual disturbance.  ?Respiratory:  Negative for shortness of breath and wheezing.   ?Cardiovascular:  Negative for chest pain, palpitations and leg swelling.  ?Gastrointestinal:  Positive for diarrhea and nausea. Negative for abdominal distention, anal bleeding, blood in stool, constipation and vomiting.  ?Genitourinary:   Negative for dysuria.  ?Neurological:  Negative for dizziness, syncope and facial asymmetry.  ? ?   ?Objective:  ?  ?Physical Exam ?Vitals and nursing note reviewed.  ?Constitutional:   ?   General: She is not in acute distress. ?   Appearance: She is not toxic-appearing or diaphoretic.  ?HENT:  ?   Head: Normocephalic and atraumatic.  ?   Right Ear: Tympanic membrane, ear canal and external ear normal.  ?   Left Ear: Tympanic membrane, ear canal and external ear normal.  ?   Nose: Nose normal.  ?   Mouth/Throat:  ?   Mouth: Mucous membranes are moist.  ?   Pharynx: Oropharynx is clear.  ?Eyes:  ?   Extraocular Movements: Extraocular movements intact.  ?   Conjunctiva/sclera: Conjunctivae normal.  ?   Pupils: Pupils are equal, round, and reactive to light.  ?Cardiovascular:  ?   Rate and Rhythm: Normal rate and regular rhythm.  ?Pulmonary:  ?   Effort: Pulmonary effort is normal. No respiratory distress.  ?   Breath sounds: Normal breath sounds. No wheezing or rales.  ?Chest:  ?   Chest wall: No tenderness.  ?Abdominal:  ?   General: Bowel sounds are normal. There is no distension.  ?   Palpations: Abdomen is soft.  ?   Tenderness: There is no abdominal tenderness. There is no guarding or rebound.  ?Musculoskeletal:  ?   Right lower leg: No edema.  ?   Left lower leg: No edema.  ?Skin: ?   General: Skin is warm and dry.  ?Neurological:  ?   General: No focal deficit present.  ?   Mental Status: She is alert and oriented to person, place, and time.  ?   Cranial Nerves: No facial asymmetry.  ?   Sensory: Sensation is intact.  ?   Motor: Motor function is intact.  ?   Gait: Gait normal.  ?Psychiatric:     ?   Mood and Affect: Mood normal.     ?   Behavior: Behavior normal.     ?   Thought Content: Thought content normal.     ?   Judgment: Judgment normal.  ? ? ?BP (!) 183/96   Pulse (!) 58   Temp (!) 97.2 ?F (36.2 ?C) (Temporal)   Ht '5\' 4"'  (1.626 m)   Wt 141 lb 6.4 oz (64.1 kg)   SpO2 96%   BMI 24.27 kg/m?  ?Wt  Readings from Last 3 Encounters:  ?03/04/22 141 lb 6.4 oz (64.1 kg)  ?02/12/22 146 lb (66.2 kg)  ?01/25/22 147 lb (66.7 kg)  ? ? ?  Health Maintenance Due  ?Topic Date Due  ? HIV Screening  Never done  ? Hepatitis C Screening  Never done  ? PAP SMEAR-Modifier  Never done  ? Zoster Vaccines- Shingrix (1 of 2) Never done  ? MAMMOGRAM  04/23/2018  ? COVID-19 Vaccine (3 - Booster for Moderna series) 02/16/2020  ? ? ?There are no preventive care reminders to display for this patient. ? ? ?Lab Results  ?Component Value Date  ? TSH 1.610 05/22/2018  ? ?Lab Results  ?Component Value Date  ? WBC 9.9 05/31/2021  ? HGB 14.0 05/31/2021  ? HCT 41.3 05/31/2021  ? MCV 85 05/31/2021  ? PLT 291 05/31/2021  ? ?Lab Results  ?Component Value Date  ? NA 141 05/31/2021  ? K 4.1 05/31/2021  ? CO2 24 05/31/2021  ? GLUCOSE 97 05/31/2021  ? BUN 10 05/31/2021  ? CREATININE 0.67 05/31/2021  ? BILITOT 0.3 05/31/2021  ? ALKPHOS 137 (H) 05/31/2021  ? AST 19 05/31/2021  ? ALT 15 05/31/2021  ? PROT 6.9 05/31/2021  ? ALBUMIN 4.8 05/31/2021  ? CALCIUM 9.9 05/31/2021  ? EGFR 101 05/31/2021  ? ?Lab Results  ?Component Value Date  ? CHOL 203 (H) 05/22/2018  ? ?Lab Results  ?Component Value Date  ? HDL 46 05/22/2018  ? ?Lab Results  ?Component Value Date  ? LDLCALC 130 (H) 05/22/2018  ? ?Lab Results  ?Component Value Date  ? TRIG 136 05/22/2018  ? ?Lab Results  ?Component Value Date  ? CHOLHDL 4.4 05/22/2018  ? ?No results found for: HGBA1C ? ?   ?Assessment & Plan:  ? ?Crystal [name] was seen today for anorexia, fatigue and nausea. ? ?Diagnoses and all orders for this visit: ? ?Viral gastroenteritis ?Benign abdominal exam today. Zofran as needed for nausea. Immodium prn for diarrhea. Push fluids, bland diet and advance as tolerated. Labs are pending.  ?-     CMP14+EGFR ?-     CBC with Differential/Platelet ?-     ondansetron (ZOFRAN) 4 MG tablet; Take 1 tablet (4 mg total) by mouth every 8 (eight) hours as needed for nausea or vomiting. ? ?Primary  hypertension ?Uncontrolled. Asymptomatic. Labs pending. Start amlodipine and monitor BP at home. Follow up in 1 week. Strict return precautions given.  ?-     CMP14+EGFR ?-     CBC with Differential/Platelet ?-     amLODipine (N

## 2022-03-05 ENCOUNTER — Other Ambulatory Visit: Payer: Self-pay | Admitting: Family Medicine

## 2022-03-05 LAB — CMP14+EGFR
ALT: 21 IU/L (ref 0–32)
AST: 18 IU/L (ref 0–40)
Albumin/Globulin Ratio: 2.4 — ABNORMAL HIGH (ref 1.2–2.2)
Albumin: 5.2 g/dL — ABNORMAL HIGH (ref 3.8–4.9)
Alkaline Phosphatase: 123 IU/L — ABNORMAL HIGH (ref 44–121)
BUN/Creatinine Ratio: 21 (ref 12–28)
BUN: 14 mg/dL (ref 8–27)
Bilirubin Total: 0.4 mg/dL (ref 0.0–1.2)
CO2: 21 mmol/L (ref 20–29)
Calcium: 10.1 mg/dL (ref 8.7–10.3)
Chloride: 103 mmol/L (ref 96–106)
Creatinine, Ser: 0.68 mg/dL (ref 0.57–1.00)
Globulin, Total: 2.2 g/dL (ref 1.5–4.5)
Glucose: 105 mg/dL — ABNORMAL HIGH (ref 70–99)
Potassium: 4.1 mmol/L (ref 3.5–5.2)
Sodium: 141 mmol/L (ref 134–144)
Total Protein: 7.4 g/dL (ref 6.0–8.5)
eGFR: 100 mL/min/{1.73_m2} (ref >59)

## 2022-03-05 LAB — CBC WITH DIFFERENTIAL/PLATELET
Basophils Absolute: 0.1 10*3/uL (ref 0.0–0.2)
Basos: 1 %
EOS (ABSOLUTE): 0.2 10*3/uL (ref 0.0–0.4)
Eos: 2 %
Hematocrit: 43.2 % (ref 34.0–46.6)
Hemoglobin: 15.1 g/dL (ref 11.1–15.9)
Immature Grans (Abs): 0 10*3/uL (ref 0.0–0.1)
Immature Granulocytes: 0 %
Lymphocytes Absolute: 3 10*3/uL (ref 0.7–3.1)
Lymphs: 31 %
MCH: 29.4 pg (ref 26.6–33.0)
MCHC: 35 g/dL (ref 31.5–35.7)
MCV: 84 fL (ref 79–97)
Monocytes Absolute: 0.7 10*3/uL (ref 0.1–0.9)
Monocytes: 7 %
Neutrophils Absolute: 5.5 10*3/uL (ref 1.4–7.0)
Neutrophils: 59 %
Platelets: 288 10*3/uL (ref 150–450)
RBC: 5.14 x10E6/uL (ref 3.77–5.28)
RDW: 12.9 % (ref 11.7–15.4)
WBC: 9.5 10*3/uL (ref 3.4–10.8)

## 2022-03-06 ENCOUNTER — Telehealth: Payer: Self-pay | Admitting: Family Medicine

## 2022-03-06 NOTE — Telephone Encounter (Signed)
Labs reviewed, patient reports still having headaches but blood pressures are running better. ?

## 2022-03-07 LAB — LIPASE: Lipase: 40 U/L (ref 14–72)

## 2022-03-07 LAB — SPECIMEN STATUS REPORT

## 2022-03-11 ENCOUNTER — Ambulatory Visit (INDEPENDENT_AMBULATORY_CARE_PROVIDER_SITE_OTHER): Payer: Commercial Managed Care - PPO | Admitting: Family Medicine

## 2022-03-11 ENCOUNTER — Encounter: Payer: Self-pay | Admitting: Family Medicine

## 2022-03-11 VITALS — BP 140/76 | HR 67 | Temp 96.2°F | Ht 64.0 in | Wt 147.0 lb

## 2022-03-11 DIAGNOSIS — I10 Essential (primary) hypertension: Secondary | ICD-10-CM

## 2022-03-11 DIAGNOSIS — J301 Allergic rhinitis due to pollen: Secondary | ICD-10-CM | POA: Diagnosis not present

## 2022-03-11 DIAGNOSIS — R232 Flushing: Secondary | ICD-10-CM | POA: Diagnosis not present

## 2022-03-11 MED ORDER — AMLODIPINE BESYLATE 10 MG PO TABS: 10.0000 mg | ORAL_TABLET | Freq: Every day | ORAL | 3 refills | Status: DC

## 2022-03-11 MED ORDER — VENLAFAXINE HCL ER 75 MG PO CP24: 150.0000 mg | ORAL_CAPSULE | Freq: Every day | ORAL | 1 refills | Status: DC

## 2022-03-11 NOTE — Progress Notes (Signed)
? ?Subjective:  ?Patient ID: Crystal [name], female    DOB: 03/21/1961  Age: 61 y.o. MRN: [id number] ? ?CC: Follow-up and Hypertension ? ? ?HPI ?Crystal [name] presents for  follow-up of hypertension. Patient has no history of headache chest pain or shortness of breath or recent cough. Patient also denies symptoms of TIA such as focal numbness or weakness. Patient denies side effects from medication. States taking it regularly. ?Readings at home running 140-150 systolic. Having HA. Gets relief with tylenol. Having sinus pressure and allergy sx as well. ? ? ?  03/11/2022  ?  2:39 PM 03/04/2022  ?  8:37 AM 02/12/2022  ?  9:52 AM 01/25/2022  ?  1:47 PM 01/11/2022  ? 11:36 AM  ?Depression screen PHQ 2/9  ?Decreased Interest 0 0 0 0 0  ?Down, Depressed, Hopeless 0 1 0 0 0  ?PHQ - 2 Score 0 1 0 0 0  ?Altered sleeping  1   0  ?Tired, decreased energy  1   0  ?Change in appetite  0   0  ?Feeling bad or failure about yourself   0   0  ?Trouble concentrating  0   0  ?Moving slowly or fidgety/restless  0   0  ?Suicidal thoughts  0   0  ?PHQ-9 Score  3   0  ?Difficult doing work/chores  Somewhat difficult   Not difficult at all  ? ? ?History ?Crystal [name] has a past medical history of IBS (irritable bowel syndrome) and Post herpetic neuralgia.  ? ?She has a past surgical history that includes Tonsillectomy and Eye muscle surgery.  ? ?Her family history includes Cancer (age of onset: 8) in her mother; Hypertension in her father.She reports that she quit smoking about 9 years ago. Her smoking use included cigarettes. She smoked an average of .5 packs per day. She has never used smokeless tobacco. She reports current alcohol use. She reports that she does not use drugs. ? ?Current Outpatient Medications on File Prior to Visit  ?Medication Sig Dispense Refill  ? diclofenac (VOLTAREN) 75 MG EC tablet TAKE 1 TABLET TWICE DAILY FOR MUSCLE AND JOINT PAIN. 60 tablet 1  ? fexofenadine-pseudoephedrine (ALLEGRA-D 24) 180-240 MG 24 hr tablet Take 1  tablet by mouth every evening. For allergy and congestion 90 tablet 3  ? gabapentin (NEURONTIN) 800 MG tablet TAKE 1 TABLET BY MOUTH 3 TIMES DAILY. 90 tablet 2  ? hyoscyamine (LEVBID) 0.375 MG 12 hr tablet TAKE 1 TABLET TWICE A DAY FOR STOOL REGULATION AND ABDOMIMAL CRAMPING 60 tablet 1  ? lidocaine (LIDODERM) 5 % Place 1 patch onto the skin daily. Remove & Discard patch within 12 hours or as directed by MD 30 patch 3  ? ondansetron (ZOFRAN) 4 MG tablet Take 1 tablet (4 mg total) by mouth every 8 (eight) hours as needed for nausea or vomiting. 20 tablet 0  ? tiZANidine (ZANAFLEX) 2 MG tablet TAKE 1 TABLET BY MOUTH THREE TIMES DAILY AS NEEDED FOR MUSCLE SPASMS. 90 tablet 0  ? ?No current facility-administered medications on file prior to visit.  ? ? ?ROS ?Review of Systems  ?Constitutional:  Positive for diaphoresis (drenching sweats at night. Occasionally daytime.).  ?HENT:  Positive for congestion.   ?Eyes:  Negative for visual disturbance.  ?Respiratory:  Negative for shortness of breath.   ?Cardiovascular:  Negative for chest pain.  ?Gastrointestinal:  Negative for abdominal pain.  ?Musculoskeletal:  Negative for arthralgias.  ?Allergic/Immunologic: Positive for environmental allergies.  ?  Neurological:  Positive for headaches.  ? ?Objective:  ?BP 140/76   Pulse 67   Temp (!) 96.2 ?F (35.7 ?C)   Ht 5\' 4"  (1.626 m)   Wt 147 lb (66.7 kg)   SpO2 97%   BMI 25.23 kg/m?  ? ?BP Readings from Last 3 Encounters:  ?03/11/22 140/76  ?03/04/22 (!) 183/96  ?02/12/22 120/73  ? ? ?Wt Readings from Last 3 Encounters:  ?03/11/22 147 lb (66.7 kg)  ?03/04/22 141 lb 6.4 oz (64.1 kg)  ?02/12/22 146 lb (66.2 kg)  ? ? ? ?Physical Exam ? ? ? ?Assessment & Plan:  ? ?Crystal NixonJanice was seen today for follow-up and hypertension. ? ?Diagnoses and all orders for this visit: ? ?Hot flashes ? ?Primary hypertension ?-     amLODipine (NORVASC) 10 MG tablet; Take 1 tablet (10 mg total) by mouth daily. ? ?Seasonal allergic rhinitis due to  pollen ? ?Other orders ?-     venlafaxine XR (EFFEXOR-XR) 75 MG 24 hr capsule; Take 2 capsules (150 mg total) by mouth daily with breakfast. ? ? ?Allergies as of 03/11/2022   ? ?   Reactions  ? Reglan [metoclopramide] Other (See Comments)  ? Muscle spasms  ? Sulfa Antibiotics Anaphylaxis  ? ?  ? ?  ?Medication List  ?  ? ?  ? Accurate as of March 11, 2022  3:10 PM. If you have any questions, ask your nurse or doctor.  ?  ?  ? ?  ? ?amLODipine 10 MG tablet ?Commonly known as: NORVASC ?Take 1 tablet (10 mg total) by mouth daily. ?What changed:  ?medication strength ?how much to take ?Changed by: Mechele ClaudeWarren Freeman Borba, MD ?  ?diclofenac 75 MG EC tablet ?Commonly known as: VOLTAREN ?TAKE 1 TABLET TWICE DAILY FOR MUSCLE AND JOINT PAIN. ?  ?fexofenadine-pseudoephedrine 180-240 MG 24 hr tablet ?Commonly known as: ALLEGRA-D 24 ?Take 1 tablet by mouth every evening. For allergy and congestion ?  ?gabapentin 800 MG tablet ?Commonly known as: NEURONTIN ?TAKE 1 TABLET BY MOUTH 3 TIMES DAILY. ?  ?hyoscyamine 0.375 MG 12 hr tablet ?Commonly known as: LEVBID ?TAKE 1 TABLET TWICE A DAY FOR STOOL REGULATION AND ABDOMIMAL CRAMPING ?  ?lidocaine 5 % ?Commonly known as: Lidoderm ?Place 1 patch onto the skin daily. Remove & Discard patch within 12 hours or as directed by MD ?  ?ondansetron 4 MG tablet ?Commonly known as: Zofran ?Take 1 tablet (4 mg total) by mouth every 8 (eight) hours as needed for nausea or vomiting. ?  ?tiZANidine 2 MG tablet ?Commonly known as: ZANAFLEX ?TAKE 1 TABLET BY MOUTH THREE TIMES DAILY AS NEEDED FOR MUSCLE SPASMS. ?  ?venlafaxine XR 75 MG 24 hr capsule ?Commonly known as: EFFEXOR-XR ?Take 2 capsules (150 mg total) by mouth daily with breakfast. ?What changed: See the new instructions. ?Changed by: Mechele ClaudeWarren Myleigh Amara, MD ?  ? ?  ? ? ?Meds ordered this encounter  ?Medications  ? amLODipine (NORVASC) 10 MG tablet  ?  Sig: Take 1 tablet (10 mg total) by mouth daily.  ?  Dispense:  90 tablet  ?  Refill:  3  ? venlafaxine XR  (EFFEXOR-XR) 75 MG 24 hr capsule  ?  Sig: Take 2 capsules (150 mg total) by mouth daily with breakfast.  ?  Dispense:  180 capsule  ?  Refill:  1  ? ? ?Avoid Allegra D. Use Allegra 180 daily instead until BP under good control ? ?Follow-up: Return in about 3 months (around 06/10/2022). ? ?Broadus JohnWarren  Kimberleigh Mehan, M.D. ?

## 2022-03-21 ENCOUNTER — Other Ambulatory Visit: Payer: Self-pay | Admitting: Family Medicine

## 2022-04-01 ENCOUNTER — Telehealth: Payer: Self-pay | Admitting: Emergency Medicine
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
## 2022-04-11 ENCOUNTER — Ambulatory Visit: Payer: Commercial Managed Care - PPO | Admitting: Family Medicine

## 2022-04-11 ENCOUNTER — Encounter: Payer: Self-pay | Admitting: Family Medicine

## 2022-04-11 VITALS — BP 168/78 | HR 79 | Temp 98.0°F | Ht 64.0 in | Wt 142.2 lb

## 2022-04-11 DIAGNOSIS — I10 Essential (primary) hypertension: Secondary | ICD-10-CM

## 2022-04-11 MED ORDER — LISINOPRIL 10 MG PO TABS: 10.0000 mg | ORAL_TABLET | Freq: Every day | ORAL | 3 refills | Status: DC
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
## 2022-05-09 ENCOUNTER — Ambulatory Visit: Payer: Commercial Managed Care - PPO | Admitting: Family Medicine

## 2022-05-09 ENCOUNTER — Encounter: Payer: Self-pay | Admitting: Family Medicine

## 2022-05-09 VITALS — BP 109/61 | HR 63 | Temp 97.4°F | Ht 64.0 in | Wt 149.0 lb

## 2022-05-09 DIAGNOSIS — I10 Essential (primary) hypertension: Secondary | ICD-10-CM | POA: Diagnosis not present

## 2022-05-09 MED ORDER — DICLOFENAC SODIUM 75 MG PO TBEC: DELAYED_RELEASE_TABLET | ORAL | 1 refills | Status: DC

## 2022-05-09 MED ORDER — LIDOCAINE 5 % EX PTCH: 1.0000 | MEDICATED_PATCH | CUTANEOUS | 11 refills | Status: DC
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
## 2022-05-10 LAB — CMP14+EGFR
ALT: 19 IU/L (ref 0–32)
AST: 19 IU/L (ref 0–40)
Albumin/Globulin Ratio: 2.2 (ref 1.2–2.2)
Albumin: 4.6 g/dL (ref 3.8–4.9)
Alkaline Phosphatase: 104 IU/L (ref 44–121)
BUN/Creatinine Ratio: 20 (ref 12–28)
BUN: 13 mg/dL (ref 8–27)
Bilirubin Total: <0.2 mg/dL (ref 0.0–1.2)
CO2: 23 mmol/L (ref 20–29)
Calcium: 9.7 mg/dL (ref 8.7–10.3)
Chloride: 103 mmol/L (ref 96–106)
Creatinine, Ser: 0.65 mg/dL (ref 0.57–1.00)
Globulin, Total: 2.1 g/dL (ref 1.5–4.5)
Glucose: 94 mg/dL (ref 70–99)
Potassium: 4.4 mmol/L (ref 3.5–5.2)
Sodium: 142 mmol/L (ref 134–144)
Total Protein: 6.7 g/dL (ref 6.0–8.5)
eGFR: 101 mL/min/{1.73_m2} (ref >59)

## 2022-05-12 NOTE — Progress Notes (Signed)
Hello Jill,  Your lab result is normal and/or stable.Some minor variations that are not significant are commonly marked abnormal, but do not represent any medical problem for you.  Best regards, Jurell Basista, M.D.

## 2022-05-13 ENCOUNTER — Telehealth: Payer: Self-pay | Admitting: Family Medicine

## 2022-05-13 NOTE — Telephone Encounter (Signed)
Patient aware that prior authorization is being submitted.  Fax sent to the plan Your PA has been faxed to the plan as a paper copy.  Please contact the plan directly if you haven't received a determination in a typical timeframe.  You will be notified of the determination via fax. How do I know if the plan approved the PA?  Add Reminder to your Dashboard Remind me in:  5 business days Contact plan to follow up on [id number] Download / Print PAReturn to Request

## 2022-05-13 NOTE — Telephone Encounter (Signed)
Pt called stating that her insurance is requiring PA for Lidocaine Patches.

## 2022-05-14 NOTE — Telephone Encounter (Signed)
Fax received, Lidocaine patches were denied, patient informed.

## 2022-06-19 ENCOUNTER — Ambulatory Visit: Payer: Commercial Managed Care - PPO | Admitting: Family Medicine

## 2022-06-20 ENCOUNTER — Other Ambulatory Visit: Payer: Self-pay | Admitting: Family Medicine

## 2022-08-07 ENCOUNTER — Ambulatory Visit: Payer: Commercial Managed Care - PPO | Admitting: Family Medicine

## 2022-08-07 ENCOUNTER — Encounter: Payer: Self-pay | Admitting: Family Medicine

## 2022-08-07 VITALS — BP 102/60 | HR 63 | Temp 97.4°F | Ht 64.0 in | Wt 147.4 lb

## 2022-08-07 DIAGNOSIS — M47816 Spondylosis without myelopathy or radiculopathy, lumbar region: Secondary | ICD-10-CM

## 2022-08-07 DIAGNOSIS — M4134 Thoracogenic scoliosis, thoracic region: Secondary | ICD-10-CM | POA: Diagnosis not present

## 2022-08-07 DIAGNOSIS — M47814 Spondylosis without myelopathy or radiculopathy, thoracic region: Secondary | ICD-10-CM

## 2022-08-07 MED ORDER — PREGABALIN 50 MG PO CAPS
ORAL_CAPSULE | ORAL | 0 refills | Status: DC
Start: 2022-08-07 — End: 2022-09-09
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
## 2022-09-04 NOTE — Patient Instructions (Signed)
Our records indicate that you are due for your annual mammogram/breast imaging. While there is no way to prevent breast cancer, early detection provides the best opportunity for curing it. For women over the age of 40, the American Cancer Society recommends a yearly clinical breast exam and a yearly mammogram. These practices have saved thousands of lives. We need your help to ensure that you are receiving optimal medical care. Please call the imaging location that has done you previous mammograms. Please remember to list us as your primary care. This helps make sure we receive a report and can update your chart.  Below is the contact information for several local breast imaging centers. You may call the location that works best for you, and they will be happy to assistance in making you an appointment. You do not need an order for a regular screening mammogram. However, if you are having any problems or concerns with you breast area, please let your primary care provider know, and appropriate orders will be placed. Please let our office know if you have any questions or concerns. Or if you need information for another imaging center not on this list or outside of the area. We are commented to working with you on your health care journey.   The mobile unit/bus ([facility]) - they come twice a month to our location.  These appointments can be made through our office or by call The Breast Center  [facility]  [address] [place], [place] [address] Phone [phone]  [facility] Radiology Department  [address]  [place], [place] [address] [phone]  [facility])  [address] [place], [place] [address] [phone]  [facility] [place]  [address] [place] [address] [phone]  [facility] [place]  [address], [address] [place] [address] [phone]  [facility] [place]  [address] [place], [place] [address] [phone]  [facility] [address] [place], [place] [address] [phone]  [facility] [address] [place], [place] [address] [phone]  [facility] [address] [place] [address] [phone]     

## 2022-09-09 ENCOUNTER — Ambulatory Visit: Payer: Commercial Managed Care - PPO | Admitting: Family Medicine

## 2022-09-09 ENCOUNTER — Encounter: Payer: Self-pay | Admitting: Family Medicine

## 2022-09-09 VITALS — BP 107/59 | HR 63 | Temp 98.0°F | Ht 64.0 in | Wt 146.8 lb

## 2022-09-09 DIAGNOSIS — Z79899 Other long term (current) drug therapy: Secondary | ICD-10-CM

## 2022-09-09 DIAGNOSIS — I10 Essential (primary) hypertension: Secondary | ICD-10-CM | POA: Diagnosis not present

## 2022-09-09 MED ORDER — LISINOPRIL 10 MG PO TABS: 10.0000 mg | ORAL_TABLET | Freq: Every day | ORAL | 3 refills | Status: DC

## 2022-09-09 MED ORDER — VENLAFAXINE HCL ER 75 MG PO CP24: 150.0000 mg | ORAL_CAPSULE | Freq: Every day | ORAL | 1 refills | Status: DC

## 2022-09-09 MED ORDER — AMLODIPINE BESYLATE 10 MG PO TABS: 10.0000 mg | ORAL_TABLET | Freq: Every day | ORAL | 3 refills | Status: DC

## 2022-09-09 MED ORDER — PREGABALIN 200 MG PO CAPS: 200.0000 mg | ORAL_CAPSULE | Freq: Every day | ORAL | 1 refills | Status: DC

## 2022-09-09 NOTE — Progress Notes (Signed)
Subjective:  Patient ID: Crystal [name], female    DOB: 22-Mar-1961  Age: 61 y.o. MRN: [id number]  CC: Follow-up and Pain (Multiple sites/)   HPI Crystal [name] presents for resolved pain but now having fecal incontinence. Poops when she pees. Sometimes she just checks and there is some there. Having regular BMs with normal urge. LEakage of smears. Tested at [employer] and sphincter is lax. \\  Lyrica working well. Wants to continue. Leakage started back before she started the lyrica.     09/09/2022    3:31 PM 08/07/2022    1:17 PM 04/11/2022    2:13 PM  Depression screen PHQ 2/9  Decreased Interest 0 0 0  Down, Depressed, Hopeless 0 0 0  PHQ - 2 Score 0 0 0    History Crystal [name] has a past medical history of IBS (irritable bowel syndrome) and Post herpetic neuralgia.   She has a past surgical history that includes Tonsillectomy and Eye muscle surgery.   Her family history includes Cancer (age of onset: 86) in her mother; Hypertension in her father.She reports that she quit smoking about 10 years ago. Her smoking use included cigarettes. She smoked an average of .5 packs per day. She has never used smokeless tobacco. She reports current alcohol use. She reports that she does not use drugs.    ROS Review of Systems  Constitutional: Negative.   HENT: Negative.    Eyes:  Negative for visual disturbance.  Respiratory:  Negative for shortness of breath.   Cardiovascular:  Negative for chest pain.  Gastrointestinal:  Negative for abdominal pain.       Encopresis - smears  Musculoskeletal:  Negative for arthralgias.    Objective:  BP (!) 107/59   Pulse 63   Temp 98 F (36.7 C)   Ht 5\' 4"  (1.626 m)   Wt 146 lb 12.8 oz (66.6 kg)   SpO2 95%   BMI 25.20 kg/m   BP Readings from Last 3 Encounters:  09/09/22 (!) 107/59  08/07/22 102/60  05/09/22 109/61    Wt Readings from Last 3 Encounters:  09/09/22 146 lb 12.8 oz (66.6 kg)  08/07/22 147 lb 6.4 oz (66.9 kg)  05/09/22 149  lb (67.6 kg)     Physical Exam Constitutional:      General: She is not in acute distress.    Appearance: She is well-developed.  Cardiovascular:     Rate and Rhythm: Normal rate and regular rhythm.  Pulmonary:     Breath sounds: Normal breath sounds.  Musculoskeletal:        General: Normal range of motion.  Skin:    General: Skin is warm and dry.  Neurological:     Mental Status: She is alert and oriented to person, place, and time.       Assessment & Plan:   Crystal [name] was seen today for follow-up and pain.  Diagnoses and all orders for this visit:  Controlled substance agreement signed -     ToxASSURE Select 13 (MW), Urine  Primary hypertension -     amLODipine (NORVASC) 10 MG tablet; Take 1 tablet (10 mg total) by mouth daily.  Other orders -     pregabalin (LYRICA) 200 MG capsule; Take 1 capsule (200 mg total) by mouth at bedtime. -     lisinopril (ZESTRIL) 10 MG tablet; Take 1 tablet (10 mg total) by mouth daily. -     venlafaxine XR (EFFEXOR-XR) 75 MG 24 hr capsule; Take 2  capsules (150 mg total) by mouth daily with breakfast.       I have changed Crystal [name]. Crystal [name]'s pregabalin. I am also having her maintain her lidocaine, diclofenac, amLODipine, lisinopril, and venlafaxine XR.  Allergies as of 09/09/2022       Reactions   Reglan [metoclopramide] Other (See Comments)   Muscle spasms   Sulfa Antibiotics Anaphylaxis        Medication List        Accurate as of September 09, 2022  4:23 PM. If you have any questions, ask your nurse or doctor.          amLODipine 10 MG tablet Commonly known as: NORVASC Take 1 tablet (10 mg total) by mouth daily.   diclofenac 75 MG EC tablet Commonly known as: VOLTAREN TAKE 1 TABLET TWICE DAILY FOR MUSCLE AND JOINT PAIN.   lidocaine 5 % Commonly known as: Lidoderm Place 1 patch onto the skin daily. Remove & Discard patch within 12 hours or as directed by MD   lisinopril 10 MG tablet Commonly known as:  ZESTRIL Take 1 tablet (10 mg total) by mouth daily.   pregabalin 200 MG capsule Commonly known as: Lyrica Take 1 capsule (200 mg total) by mouth at bedtime. What changed:  medication strength how much to take how to take this when to take this additional instructions Changed by: Mechele Claude, MD   venlafaxine XR 75 MG 24 hr capsule Commonly known as: EFFEXOR-XR Take 2 capsules (150 mg total) by mouth daily with breakfast.       Kegel Exercises reviewed.  Follow-up: Return in about 6 months (around 03/11/2023).  Mechele Claude, M.D.

## 2022-09-10 ENCOUNTER — Telehealth: Payer: Self-pay | Admitting: Family Medicine
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
## 2022-09-10 NOTE — Telephone Encounter (Signed)
Pt returned missed call. Reviewed Dr Livia Snellen note with pt. Pt voiced understanding.

## 2022-09-10 NOTE — Telephone Encounter (Signed)
Okay. No other option available for her.

## 2022-09-10 NOTE — Telephone Encounter (Signed)
RETURNED CALL, NO ANSWER 

## 2022-09-14 LAB — TOXASSURE SELECT 13 (MW), URINE

## 2022-11-13 ENCOUNTER — Ambulatory Visit: Payer: Commercial Managed Care - PPO | Admitting: Family Medicine

## 2022-12-04 ENCOUNTER — Encounter (HOSPITAL_BASED_OUTPATIENT_CLINIC_OR_DEPARTMENT_OTHER): Payer: Self-pay

## 2022-12-04 DIAGNOSIS — G4733 Obstructive sleep apnea (adult) (pediatric): Secondary | ICD-10-CM

## 2022-12-04 DIAGNOSIS — R5383 Other fatigue: Secondary | ICD-10-CM

## 2022-12-04 DIAGNOSIS — R0683 Snoring: Secondary | ICD-10-CM

## 2022-12-10 ENCOUNTER — Ambulatory Visit: Payer: BC Managed Care – PPO | Admitting: Orthopedic Surgery

## 2022-12-18 ENCOUNTER — Ambulatory Visit: Payer: BC Managed Care – PPO | Admitting: Orthopedic Surgery

## 2022-12-19 ENCOUNTER — Ambulatory Visit: Payer: Commercial Managed Care - PPO | Admitting: Family Medicine

## 2023-03-24 ENCOUNTER — Ambulatory Visit: Payer: Self-pay | Admitting: Family

## 2023-03-25 ENCOUNTER — Encounter: Payer: Self-pay | Admitting: Family Medicine

## 2023-03-25 ENCOUNTER — Telehealth: Payer: BC Managed Care – PPO | Admitting: Family Medicine

## 2023-03-25 DIAGNOSIS — R21 Rash and other nonspecific skin eruption: Secondary | ICD-10-CM | POA: Diagnosis not present

## 2023-03-25 NOTE — Progress Notes (Signed)
Virtual Visit via Video Note  I connected with Crystal [name] on 03/25/23 at  6:15 PM EDT by a video enabled telemedicine application and verified that I am speaking with the correct person using two identifiers.  Patient Location: Home Provider Location: Office/Clinic  I discussed the limitations, risks, security, and privacy concerns of performing an evaluation and management service by video and the availability of in person appointments. I also discussed with the patient that there may be a patient responsible charge related to this service. The patient expressed understanding and agreed to proceed.  Subjective: PCP: Mechele Claude, MD  CC: rash with severe headache   HPI Reports that she was diagnosed with shingles from a doctor at her work - [facility]. She has started on Cytogeneticist. The rash is across her forehead. She is taking tylenol, ibuprofen, advil for the headache, but it is not helping. The rash started itching on Thursday, believed it to be eczema. Then it started to worsen with headache, she also has nausea. Started valcyclovir yesterday morning (Monday).   ROS: Per HPI  Current Outpatient Medications:    amLODipine (NORVASC) 10 MG tablet, Take 1 tablet (10 mg total) by mouth daily., Disp: 90 tablet, Rfl: 3   diclofenac (VOLTAREN) 75 MG EC tablet, TAKE 1 TABLET TWICE DAILY FOR MUSCLE AND JOINT PAIN., Disp: 180 tablet, Rfl: 1   lidocaine (LIDODERM) 5 %, Place 1 patch onto the skin daily. Remove & Discard patch within 12 hours or as directed by MD, Disp: 30 patch, Rfl: 11   lisinopril (ZESTRIL) 10 MG tablet, Take 1 tablet (10 mg total) by mouth daily., Disp: 90 tablet, Rfl: 3   pregabalin (LYRICA) 200 MG capsule, Take 1 capsule (200 mg total) by mouth at bedtime., Disp: 90 capsule, Rfl: 1   venlafaxine XR (EFFEXOR-XR) 75 MG 24 hr capsule, Take 2 capsules (150 mg total) by mouth daily with breakfast., Disp: 180 capsule, Rfl: 1  Observations/Objective: There  were no vitals filed for this visit. Physical Exam Constitutional:      General: She is awake.     Appearance: Normal appearance. She is well-developed and well-groomed.  Skin:    Findings: Rash present. Rash is pustular and vesicular.     Comments: Erythematous, along hairline   Neurological:     Mental Status: She is alert.  Psychiatric:        Attention and Perception: Attention and perception normal.        Mood and Affect: Mood and affect normal.        Speech: Speech normal.        Behavior: Behavior normal. Behavior is not agitated, aggressive or hyperactive. Behavior is cooperative.        Thought Content: Thought content normal.        Cognition and Memory: Cognition and memory normal.        Judgment: Judgment normal.    Assessment and Plan: 1. Rash of face Patient is currently being treated with valcyclovir for shingles infection per diagnosis from outside provider. Per patient this is her fourth time having shingles. Educated patient at length of CN involvement and signs to look for. Concerned that patient may require IV antivirals given location of rash. Encouraged patient to follow up closely with optometrist for eye exam. She has established optometrist. Discussed with patient that it is unusual for shingles to cross the midline of the body as her rash is doing. Patient attempted to send a picture of rash  via Mychart, but was unsuccessful. Encouraged patient to follow up closely.   The above assessment and management plan was discussed with the patient. The patient verbalized understanding of and has agreed to the management plan using shared-decision making. Patient is aware to call the clinic if they develop any new symptoms or if symptoms fail to improve or worsen. Patient is aware when to return to the clinic for a follow-up visit. Patient educated on when it is appropriate to go to the emergency department.   Follow Up Instructions: Instructed patient to follow up  extremely closely and to monitor for symptoms of CN involvement!    I discussed the assessment and treatment plan with the patient. The patient was provided an opportunity to ask questions, and all were answered. The patient agreed with the plan and demonstrated an understanding of the instructions.   The patient was advised to call back or seek an in-person evaluation if the symptoms worsen or if the condition fails to improve as anticipated.  Neale Burly, DNP-FNP [facility] [address] [place], [place] [address] [phone]

## 2023-04-24 ENCOUNTER — Telehealth: Payer: BC Managed Care – PPO | Admitting: Family

## 2023-04-24 ENCOUNTER — Encounter: Payer: Self-pay | Admitting: Family

## 2023-04-24 ENCOUNTER — Other Ambulatory Visit: Payer: BC Managed Care – PPO

## 2023-04-24 ENCOUNTER — Other Ambulatory Visit: Payer: Self-pay

## 2023-04-24 DIAGNOSIS — R300 Dysuria: Secondary | ICD-10-CM

## 2023-04-24 DIAGNOSIS — R399 Unspecified symptoms and signs involving the genitourinary system: Secondary | ICD-10-CM | POA: Diagnosis not present

## 2023-04-24 LAB — URINALYSIS, COMPLETE
Bilirubin, UA: NEGATIVE
Glucose, UA: NEGATIVE
Ketones, UA: NEGATIVE
Leukocytes,UA: NEGATIVE
Nitrite, UA: NEGATIVE
Protein,UA: NEGATIVE
RBC, UA: NEGATIVE
Specific Gravity, UA: 1.02 (ref 1.005–1.030)
Urobilinogen, Ur: 0.2 mg/dL (ref 0.2–1.0)
pH, UA: 5.5 (ref 5.0–7.5)

## 2023-04-24 LAB — MICROSCOPIC EXAMINATION: Bacteria, UA: NONE SEEN

## 2023-04-24 MED ORDER — CEPHALEXIN 500 MG PO CAPS: 500.0000 mg | ORAL_CAPSULE | Freq: Two times a day (BID) | ORAL | 0 refills | Status: DC

## 2023-04-24 NOTE — Progress Notes (Signed)
Virtual Visit Consent   Crystal [name], you are scheduled for a virtual visit with a [facility] provider today. Just as with appointments in the office, your consent must be obtained to participate. Your consent will be active for this visit and any virtual visit you may have with one of our providers in the next 365 days. If you have a MyChart account, a copy of this consent can be sent to you electronically.  As this is a virtual visit, video technology does not allow for your provider to perform a traditional examination. This may limit your provider's ability to fully assess your condition. If your provider identifies any concerns that need to be evaluated in person or the need to arrange testing (such as labs, EKG, etc.), we will make arrangements to do so. Although advances in technology are sophisticated, we cannot ensure that it will always work on either your end or our end. If the connection with a video visit is poor, the visit may have to be switched to a telephone visit. With either a video or telephone visit, we are not always able to ensure that we have a secure connection.  By engaging in this virtual visit, you consent to the provision of healthcare and authorize for your insurance to be billed (if applicable) for the services provided during this visit. Depending on your insurance coverage, you may receive a charge related to this service.  I need to obtain your verbal consent now. Are you willing to proceed with your visit today? LIBORIA MCENANEY has provided verbal consent on 04/24/2023 for a virtual visit (video or telephone). Jannifer Rodney, FNP  Date: 04/24/2023 11:51 AM  Virtual Visit via Video Note   I, Jannifer Rodney, connected with  Crystal [name]  ([id number], [birth year]) on 04/24/23 at  5:15 PM EDT by a video-enabled telemedicine application and verified that I am speaking with the correct person using two identifiers.  Location: Patient: Virtual Visit Location Patient:  Other: work Provider: Pharmacist, community: Home Office   I discussed the limitations of evaluation and management by telemedicine and the availability of in person appointments. The patient expressed understanding and agreed to proceed.    History of Present Illness: Crystal [name] is a 62 y.o. who identifies as a female who was assigned female at birth, and is being seen today for urinary frequency that started the last few days .  HPI: Urinary Frequency  This is a new problem. The current episode started in the past 7 days. The problem occurs every urination. The problem has been gradually worsening. The pain is at a severity of 6/10. The pain is mild. There has been no fever. Associated symptoms include frequency, hesitancy and urgency. Pertinent negatives include no flank pain, hematuria, nausea or vomiting. She has tried increased fluids for the symptoms. The treatment provided mild relief.    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    Allergies:  Allergies  Allergen Reactions   Reglan [Metoclopramide] Other (See Comments)    Muscle spasms   Sulfa Antibiotics Anaphylaxis   Medications:  Current Outpatient Medications:    cephALEXin (KEFLEX) 500 MG capsule, Take 1 capsule (500 mg total) by mouth  2 (two) times daily., Disp: 14 capsule, Rfl: 0   amLODipine (NORVASC) 10 MG tablet, Take 1 tablet (10 mg total) by mouth daily., Disp: 90 tablet, Rfl: 3   diclofenac (VOLTAREN) 75 MG EC tablet, TAKE 1 TABLET TWICE DAILY FOR MUSCLE AND JOINT PAIN., Disp: 180 tablet, Rfl: 1   lidocaine (LIDODERM) 5 %, Place 1 patch onto the skin daily. Remove & Discard patch within 12 hours or as directed  by MD, Disp: 30 patch, Rfl: 11   lisinopril (ZESTRIL) 10 MG tablet, Take 1 tablet (10 mg total) by mouth daily., Disp: 90 tablet, Rfl: 3  Observations/Objective: Patient is well-developed, well-nourished in no acute distress.  Resting comfortably  Head is normocephalic, atraumatic.  No labored breathing.  Speech is clear and coherent with logical content.  Patient is alert and oriented at baseline.    Assessment and Plan: 1. Dysuria - Urine Culture - Urinalysis, Complete  2. UTI symptoms - cephALEXin (KEFLEX) 500 MG capsule; Take 1 capsule (500 mg total) by mouth 2 (two) times daily.  Dispense: 14 capsule; Refill: 0  Force fluids PT will take AZO if symptoms worsen or do not improve will start Keflex  Urine culture pending  Follow up if symptoms worsen or do not improve   Follow Up Instructions: I discussed the assessment and treatment plan with the patient. The patient was provided an opportunity to ask questions and all were answered. The patient agreed with the plan and demonstrated an understanding of the instructions.  A copy of instructions were sent to the patient via MyChart unless otherwise noted below.     The patient was advised to call back or seek an in-person evaluation if the symptoms worsen or if the condition fails to improve as anticipated.  Time:  I spent 6 minutes with the patient via telehealth technology discussing the above problems/concerns.    Jannifer Rodney, FNP

## 2023-04-25 LAB — URINE CULTURE: Organism ID, Bacteria: NO GROWTH

## 2023-06-17 ENCOUNTER — Other Ambulatory Visit: Payer: Self-pay | Admitting: Family Medicine

## 2023-06-25 ENCOUNTER — Other Ambulatory Visit: Payer: Self-pay | Admitting: Family Medicine

## 2023-06-25 DIAGNOSIS — I10 Essential (primary) hypertension: Secondary | ICD-10-CM

## 2023-08-27 ENCOUNTER — Other Ambulatory Visit: Payer: Self-pay | Admitting: Family Medicine

## 2023-08-27 MED ORDER — DICLOFENAC SODIUM 75 MG PO TBEC: DELAYED_RELEASE_TABLET | ORAL | 0 refills | Status: DC

## 2023-08-27 NOTE — Telephone Encounter (Signed)
  Prescription Request  08/27/2023  Is this a "Controlled Substance" medicine? no  Have you seen your PCP in the last 2 weeks? no  If YES, route message to pool  -  If NO, patient needs to be scheduled for appointment.  What is the name of the medication or equipment? Diclofenac 75 mg - Patient has appt 10-23  Have you contacted your pharmacy to request a refill? yes   Which pharmacy would you like this sent to? [facility]    Patient notified that their request is being sent to the clinical staff for review and that they should receive a response within 2 business days.

## 2023-08-27 NOTE — Telephone Encounter (Signed)
Stacks NTBS Last OV 09/09/22

## 2023-08-27 NOTE — Telephone Encounter (Signed)
Pt scheduled to see PCP on 09/18/23

## 2023-08-28 NOTE — Addendum Note (Signed)
Addended by: Julious Payer D on: 08/28/2023 08:47 AM   Modules accepted: Orders

## 2023-08-31 MED ORDER — DICLOFENAC SODIUM 75 MG PO TBEC: DELAYED_RELEASE_TABLET | ORAL | 0 refills | Status: DC

## 2023-09-17 ENCOUNTER — Encounter: Payer: BC Managed Care – PPO | Admitting: Family Medicine

## 2023-09-30 ENCOUNTER — Encounter: Payer: Self-pay | Admitting: Family Medicine

## 2023-09-30 ENCOUNTER — Ambulatory Visit (INDEPENDENT_AMBULATORY_CARE_PROVIDER_SITE_OTHER): Payer: BC Managed Care – PPO | Admitting: Family Medicine

## 2023-09-30 VITALS — BP 115/63 | HR 51 | Temp 97.4°F | Ht 64.0 in | Wt 131.2 lb

## 2023-09-30 DIAGNOSIS — I10 Essential (primary) hypertension: Secondary | ICD-10-CM | POA: Diagnosis not present

## 2023-09-30 DIAGNOSIS — Z0000 Encounter for general adult medical examination without abnormal findings: Secondary | ICD-10-CM

## 2023-09-30 DIAGNOSIS — B0229 Other postherpetic nervous system involvement: Secondary | ICD-10-CM | POA: Diagnosis not present

## 2023-09-30 DIAGNOSIS — J301 Allergic rhinitis due to pollen: Secondary | ICD-10-CM | POA: Diagnosis not present

## 2023-09-30 DIAGNOSIS — Z0001 Encounter for general adult medical examination with abnormal findings: Secondary | ICD-10-CM

## 2023-09-30 DIAGNOSIS — R011 Cardiac murmur, unspecified: Secondary | ICD-10-CM | POA: Diagnosis not present

## 2023-09-30 MED ORDER — FEXOFENADINE HCL 180 MG PO TABS: 180.0000 mg | ORAL_TABLET | Freq: Every day | ORAL | 11 refills | Status: DC

## 2023-09-30 MED ORDER — AMLODIPINE BESYLATE 10 MG PO TABS: 10.0000 mg | ORAL_TABLET | Freq: Every day | ORAL | 3 refills | Status: DC

## 2023-09-30 MED ORDER — LIDOCAINE 5 % EX PTCH: 1.0000 | MEDICATED_PATCH | CUTANEOUS | 11 refills | Status: AC

## 2023-09-30 MED ORDER — FLUTICASONE PROPIONATE 50 MCG/ACT NA SUSP: 2.0000 | Freq: Every day | NASAL | 6 refills | Status: DC

## 2023-09-30 MED ORDER — LISINOPRIL 10 MG PO TABS: 10.0000 mg | ORAL_TABLET | Freq: Every day | ORAL | 3 refills | Status: DC

## 2023-09-30 NOTE — Progress Notes (Signed)
Subjective:  Patient ID: Crystal [name], female    DOB: 1961/04/23  Age: 62 y.o. MRN: [id number]  CC: Annual Exam   HPI TAWANA PASCH presents for Annual exam without GYN component. Planning to see GYN and [facility].   Ice and heat for PHN work mostly. Off of gabapentin. It may have caused her fecal incontinence. Taking diclofenac for breakthrough     09/30/2023   10:43 AM 09/09/2022    3:31 PM 08/07/2022    1:17 PM  Depression screen PHQ 2/9  Decreased Interest 0 0 0  Down, Depressed, Hopeless 0 0 0  PHQ - 2 Score 0 0 0    History Mazi has a past medical history of IBS (irritable bowel syndrome) and Post herpetic neuralgia.   She has a past surgical history that includes Tonsillectomy and Eye muscle surgery.   Her family history includes Cancer (age of onset: 50) in her mother; Hypertension in her father.She reports that she quit smoking about 11 years ago. Her smoking use included cigarettes. She has never used smokeless tobacco. She reports current alcohol use. She reports that she does not use drugs.    ROS Review of Systems  Constitutional:  Negative for appetite change, chills, diaphoresis, fatigue, fever and unexpected weight change.  HENT:  Positive for ear pain (get stopped up frequently.) and hearing loss. Negative for congestion, postnasal drip, rhinorrhea, sneezing, sore throat and trouble swallowing.   Eyes:  Positive for visual disturbance (cataract getting worse. Treating so far with glasses). Negative for pain.  Respiratory:  Negative for cough, chest tightness and shortness of breath.   Cardiovascular:  Negative for chest pain and palpitations.  Gastrointestinal:  Negative for abdominal pain, constipation, diarrhea, nausea and vomiting.  Endocrine: Negative for cold intolerance, heat intolerance, polydipsia, polyphagia and polyuria.  Genitourinary:  Negative for dysuria, frequency and menstrual problem.  Musculoskeletal:  Negative  for arthralgias and joint swelling.  Skin:  Negative for rash.  Allergic/Immunologic: Negative for environmental allergies.  Neurological:  Negative for dizziness, weakness, numbness and headaches.  Psychiatric/Behavioral:  Negative for agitation and dysphoric mood.     Objective:  BP 115/63   Pulse (!) 51   Temp (!) 97.4 F (36.3 C)   Ht 5\' 4"  (1.626 m)   Wt 131 lb 3.2 oz (59.5 kg)   SpO2 95%   BMI 22.52 kg/m   BP Readings from Last 3 Encounters:  09/30/23 115/63  09/09/22 (!) 107/59  08/07/22 102/60    Wt Readings from Last 3 Encounters:  09/30/23 131 lb 3.2 oz (59.5 kg)  09/09/22 146 lb 12.8 oz (66.6 kg)  08/07/22 147 lb 6.4 oz (66.9 kg)     Physical Exam Constitutional:      General: She is not in acute distress.    Appearance: She is well-developed.  HENT:     Head: Normocephalic and atraumatic.  Eyes:     Conjunctiva/sclera: Conjunctivae normal.     Pupils: Pupils are equal, round, and reactive to light.  Neck:     Thyroid: No thyromegaly.  Cardiovascular:     Rate and Rhythm: Normal rate and regular rhythm.     Heart sounds: Murmur heard.     Systolic murmur is present with a grade of 2/6.     No diastolic murmur is present.  Pulmonary:     Effort: Pulmonary effort is normal. No respiratory distress.     Breath sounds: Normal breath sounds. No wheezing  or rales.  Abdominal:     General: Bowel sounds are normal. There is no distension.     Palpations: Abdomen is soft.     Tenderness: There is no abdominal tenderness.  Musculoskeletal:        General: Normal range of motion.     Cervical back: Normal range of motion and neck supple.  Lymphadenopathy:     Cervical: No cervical adenopathy.  Skin:    General: Skin is warm and dry.  Neurological:     Mental Status: She is alert and oriented to person, place, and time.  Psychiatric:        Behavior: Behavior normal.        Thought Content: Thought content normal.        Judgment: Judgment normal.        Assessment & Plan:   Dean was seen today for annual exam.  Diagnoses and all orders for this visit:  Annual physical exam -     CBC with Differential/Platelet -     CMP14+EGFR -     Lipid panel -     VITAMIN D 25 Hydroxy (Vit-D Deficiency, Fractures) -     Urinalysis  Primary hypertension -     CBC with Differential/Platelet -     CMP14+EGFR -     Urinalysis -     amLODipine (NORVASC) 10 MG tablet; Take 1 tablet (10 mg total) by mouth daily. -     EKG 12-Lead  Seasonal allergic rhinitis due to pollen  PHN (postherpetic neuralgia)  Murmur, cardiac -     EKG 12-Lead  Other orders -     lisinopril (ZESTRIL) 10 MG tablet; Take 1 tablet (10 mg total) by mouth daily. -     lidocaine (LIDODERM) 5 %; Place 1 patch onto the skin daily. Remove & Discard patch within 12 hours or as directed by MD -     fexofenadine (ALLEGRA) 180 MG tablet; Take 1 tablet (180 mg total) by mouth daily. For allergy symptoms -     fluticasone (FLONASE) 50 MCG/ACT nasal spray; Place 2 sprays into both nostrils daily.       I have discontinued [name]. Andreas's cephALEXin. I am also having her start on fexofenadine and fluticasone. Additionally, I am having her maintain her diclofenac, amLODipine, lisinopril, and lidocaine.  Allergies as of 09/30/2023       Reactions   Reglan [metoclopramide] Other (See Comments)   Muscle spasms   Sulfa Antibiotics Anaphylaxis        Medication List        Accurate as of September 30, 2023 11:21 AM. If you have any questions, ask your nurse or doctor.          STOP taking these medications    cephALEXin 500 MG capsule Commonly known as: KEFLEX Stopped by: Sreeja Spies       TAKE these medications    amLODipine 10 MG tablet Commonly known as: NORVASC Take 1 tablet (10 mg total) by mouth daily.   diclofenac 75 MG EC tablet Commonly known as: VOLTAREN TAKE 1 TABLET 2 TIMES A DAY FOR MUSCLE AND JOINT PAIN.   fexofenadine 180 MG  tablet Commonly known as: ALLEGRA Take 1 tablet (180 mg total) by mouth daily. For allergy symptoms Started by: Johnpatrick Jenny   fluticasone 50 MCG/ACT nasal spray Commonly known as: FLONASE Place 2 sprays into both nostrils daily. Started by: Cainan Trull   lidocaine 5 % Commonly known as: Lidoderm  Place 1 patch onto the skin daily. Remove & Discard patch within 12 hours or as directed by MD   lisinopril 10 MG tablet Commonly known as: ZESTRIL Take 1 tablet (10 mg total) by mouth daily.         Follow-up: Return in about 6 months (around 03/29/2024).  Mechele Claude, M.D.

## 2023-10-01 ENCOUNTER — Encounter: Payer: Self-pay | Admitting: Family Medicine

## 2023-10-01 ENCOUNTER — Other Ambulatory Visit: Payer: Self-pay | Admitting: Family Medicine

## 2023-10-01 MED ORDER — DICLOFENAC SODIUM 75 MG PO TBEC: DELAYED_RELEASE_TABLET | ORAL | 0 refills | Status: DC

## 2023-10-01 NOTE — Telephone Encounter (Signed)
Copied from CRM [id number]. Topic: Clinical - Medication Refill >> Oct 01, 2023  9:37 AM Desma Mcgregor wrote: Most Recent Primary Care Visit:  Provider: [facility]  Department: [facility]  Visit Type: LAB  Date: 04/24/2023  Medication: ***  Has the patient contacted their pharmacy?  (Agent: If no, request that the patient contact the pharmacy for the refill. If patient does not wish to contact the pharmacy document the reason why and proceed with request.) (Agent: If yes, when and what did the pharmacy advise?)  Has the prescription been filled recently?   Is the patient out of the medication?   Has the patient been seen for an appointment in the last year OR does the patient have an upcoming appointment?   Can we respond through MyChart?   Agent: Please be advised that Rx refills may take up to 3 business days. We ask that you follow-up with your pharmacy.

## 2023-10-05 ENCOUNTER — Other Ambulatory Visit: Payer: Self-pay | Admitting: Family Medicine

## 2023-10-05 MED ORDER — DICLOFENAC SODIUM 75 MG PO TBEC: DELAYED_RELEASE_TABLET | ORAL | 0 refills | Status: DC

## 2023-10-06 ENCOUNTER — Other Ambulatory Visit (HOSPITAL_COMMUNITY): Payer: Self-pay

## 2023-11-05 ENCOUNTER — Telehealth: Payer: Self-pay

## 2023-11-05 NOTE — Telephone Encounter (Signed)
*  Primary  Pharmacy Patient Advocate Encounter   Received notification from CoverMyMeds that prior authorization for Lidocaine 5% patches  is required/requested.   Insurance verification completed.   The patient is insured through  [facility]  .   Per test claim: PA required; PA submitted to above mentioned insurance via CoverMyMeds Key/confirmation #/EOC [id number] Status is pending

## 2023-11-07 ENCOUNTER — Telehealth: Payer: Self-pay | Admitting: Family Medicine

## 2023-11-07 DIAGNOSIS — Z0279 Encounter for issue of other medical certificate: Secondary | ICD-10-CM

## 2023-11-07 NOTE — Telephone Encounter (Signed)
[employer] faxed  FMLA forms to be completed and signed.  Form Fee Paid? (Y/N)       y     If NO, form is placed on front office manager desk to hold until payment received. If YES, then form will be placed in the RX/HH Nurse Coordinators box for completion.  Form will not be processed until payment is received

## 2023-11-11 ENCOUNTER — Other Ambulatory Visit (HOSPITAL_COMMUNITY): Payer: Self-pay

## 2023-11-12 ENCOUNTER — Other Ambulatory Visit (HOSPITAL_COMMUNITY): Payer: Self-pay

## 2023-11-12 NOTE — Telephone Encounter (Signed)
PCP completed and signed FMLA forms. They have been faxed to [facility] at fax number [phone]. Patient has been contacted and informed they are complete.

## 2023-11-13 ENCOUNTER — Other Ambulatory Visit (HOSPITAL_COMMUNITY): Payer: Self-pay

## 2023-11-14 ENCOUNTER — Other Ambulatory Visit (HOSPITAL_COMMUNITY): Payer: Self-pay

## 2023-12-03 NOTE — Telephone Encounter (Signed)
 Med not covered- patient may use OTC 4% patches

## 2024-03-02 ENCOUNTER — Other Ambulatory Visit: Payer: Self-pay | Admitting: Family Medicine

## 2024-03-29 ENCOUNTER — Ambulatory Visit: Payer: BC Managed Care – PPO | Admitting: Family Medicine

## 2024-03-29 ENCOUNTER — Encounter: Payer: Self-pay | Admitting: Family Medicine

## 2024-03-29 VITALS — BP 99/50 | HR 74 | Temp 97.8°F | Ht 64.0 in | Wt 125.0 lb

## 2024-03-29 DIAGNOSIS — J301 Allergic rhinitis due to pollen: Secondary | ICD-10-CM

## 2024-03-29 DIAGNOSIS — E782 Mixed hyperlipidemia: Secondary | ICD-10-CM | POA: Diagnosis not present

## 2024-03-29 DIAGNOSIS — I10 Essential (primary) hypertension: Secondary | ICD-10-CM

## 2024-03-29 DIAGNOSIS — E559 Vitamin D deficiency, unspecified: Secondary | ICD-10-CM | POA: Diagnosis not present

## 2024-03-29 DIAGNOSIS — B0229 Other postherpetic nervous system involvement: Secondary | ICD-10-CM

## 2024-03-29 LAB — URINALYSIS
Bilirubin, UA: NEGATIVE
Glucose, UA: NEGATIVE
Ketones, UA: NEGATIVE
Leukocytes,UA: NEGATIVE
Nitrite, UA: NEGATIVE
Protein,UA: NEGATIVE
Specific Gravity, UA: 1.02 (ref 1.005–1.030)
Urobilinogen, Ur: 0.2 mg/dL (ref 0.2–1.0)
pH, UA: 6 (ref 5.0–7.5)

## 2024-03-29 LAB — LIPID PANEL

## 2024-03-29 MED ORDER — DICLOFENAC SODIUM 75 MG PO TBEC: DELAYED_RELEASE_TABLET | ORAL | 5 refills | Status: DC

## 2024-03-29 MED ORDER — BETAMETHASONE SOD PHOS & ACET 6 (3-3) MG/ML IJ SUSP
6.0000 mg | Freq: Once | INTRAMUSCULAR | Status: AC
  Administered 2024-03-29: 6 mg via INTRAMUSCULAR

## 2024-03-29 NOTE — Progress Notes (Signed)
 Subjective:  Patient ID: Crystal [name], female    DOB: 04-05-1961  Age: 63 y.o. MRN: [id number]  CC: Medical Management of Chronic Issues (No concerns at this time.)   HPI Crystal [name] presents for  presents for  follow-up of hypertension. Patient has no history of headache chest pain or shortness of breath or recent cough. Patient also denies symptoms of TIA such as focal numbness or weakness. Patient denies side effects from medication. States taking it regularly.   in for follow-up of elevated cholesterol. Doing well without complaints on current medication. Denies side effects of statin including myalgia and arthralgia and nausea. Currently no chest pain, shortness of breath or other cardiovascular related symptoms noted.  Patient has allergic rhinitis symptoms including sneezing frequently sniffling, clear rhinorrhea, watery and itchy eyes. There has been no fever no chills no sweats. No earaches. There is some scratchy throat but no sore throat or difficulty swallowing. There is some nasal congestion.        03/29/2024    9:03 AM 09/30/2023   10:43 AM 09/09/2022    3:31 PM  Depression screen PHQ 2/9  Decreased Interest 0 0 0  Down, Depressed, Hopeless 0 0 0  PHQ - 2 Score 0 0 0  Altered sleeping 0    Tired, decreased energy 0    Change in appetite 0    Feeling bad or failure about yourself  0    Trouble concentrating 0    Moving slowly or fidgety/restless 0    Suicidal thoughts 0    PHQ-9 Score 0    Difficult doing work/chores Not difficult at all      History Crystal [name] has a past medical history of IBS (irritable bowel syndrome) and Post herpetic neuralgia.   She has a past surgical history that includes Tonsillectomy and Eye muscle surgery.   Her family history includes Cancer (age of onset: 71) in her mother; Hypertension in her father.She reports that she quit smoking about 11 years ago. Her smoking use included cigarettes. She has never used smokeless tobacco. She  reports current alcohol use. She reports that she does not use drugs.    ROS Review of Systems  Constitutional: Negative.   HENT: Negative.    Eyes:  Negative for visual disturbance.  Respiratory:  Negative for shortness of breath.   Cardiovascular:  Negative for chest pain.  Gastrointestinal:  Negative for abdominal pain.  Musculoskeletal:  Negative for arthralgias.    Objective:  BP (!) 99/50   Pulse 74   Temp 97.8 F (36.6 C)   Ht 5\' 4"  (1.626 m)   Wt 125 lb (56.7 kg)   SpO2 93%   BMI 21.46 kg/m   BP Readings from Last 3 Encounters:  03/29/24 (!) 99/50  09/30/23 115/63  09/09/22 (!) 107/59    Wt Readings from Last 3 Encounters:  03/29/24 125 lb (56.7 kg)  09/30/23 131 lb 3.2 oz (59.5 kg)  09/09/22 146 lb 12.8 oz (66.6 kg)     Physical Exam Constitutional:      General: She is not in acute distress.    Appearance: She is well-developed.  Cardiovascular:     Rate and Rhythm: Normal rate and regular rhythm.  Pulmonary:     Breath sounds: Normal breath sounds.  Musculoskeletal:        General: Normal range of motion.  Skin:    General: Skin is warm and dry.  Neurological:     Mental Status: She is  alert and oriented to person, place, and time.      Assessment & Plan:  Primary hypertension -     CBC with Differential/Platelet -     CMP14+EGFR -     Urinalysis  Seasonal allergic rhinitis due to pollen -     Betamethasone Sod Phos & Acet  Mixed hyperlipidemia -     Lipid panel  Vitamin D deficiency -     VITAMIN D 25 Hydroxy (Vit-D Deficiency, Fractures)  PHN (postherpetic neuralgia) -     Diclofenac  Sodium; TAKE 1 TABLET 2 TIMES A DAY FOR MUSCLE AND JOINT PAIN.  Dispense: 60 tablet; Refill: 5     Follow-up: Return in about 6 months (around 09/29/2024) for Compete physical.  Roise Cleaver, M.D.

## 2024-03-30 ENCOUNTER — Encounter: Payer: Self-pay | Admitting: Family Medicine

## 2024-03-30 LAB — CBC WITH DIFFERENTIAL/PLATELET
Basophils Absolute: 0.1 10*3/uL (ref 0.0–0.2)
Basos: 2 %
EOS (ABSOLUTE): 0.3 10*3/uL (ref 0.0–0.4)
Eos: 4 %
Hematocrit: 37.8 % (ref 34.0–46.6)
Hemoglobin: 12.9 g/dL (ref 11.1–15.9)
Immature Grans (Abs): 0 10*3/uL (ref 0.0–0.1)
Immature Granulocytes: 0 %
Lymphocytes Absolute: 2.6 10*3/uL (ref 0.7–3.1)
Lymphs: 35 %
MCH: 29.4 pg (ref 26.6–33.0)
MCHC: 34.1 g/dL (ref 31.5–35.7)
MCV: 86 fL (ref 79–97)
Monocytes Absolute: 0.5 10*3/uL (ref 0.1–0.9)
Monocytes: 6 %
Neutrophils Absolute: 3.9 10*3/uL (ref 1.4–7.0)
Neutrophils: 53 %
Platelets: 325 10*3/uL (ref 150–450)
RBC: 4.39 x10E6/uL (ref 3.77–5.28)
RDW: 12 % (ref 11.7–15.4)
WBC: 7.5 10*3/uL (ref 3.4–10.8)

## 2024-03-30 LAB — LIPID PANEL
Cholesterol, Total: 196 mg/dL (ref 100–199)
HDL: 51 mg/dL (ref >39)
LDL CALC COMMENT:: 3.8 ratio (ref 0.0–4.4)
LDL Chol Calc (NIH): 130 mg/dL — ABNORMAL HIGH (ref 0–99)
Triglycerides: 85 mg/dL (ref 0–149)
VLDL Cholesterol Cal: 15 mg/dL (ref 5–40)

## 2024-03-30 LAB — CMP14+EGFR
ALT: 12 IU/L (ref 0–32)
AST: 17 IU/L (ref 0–40)
Albumin: 4.6 g/dL (ref 3.9–4.9)
Alkaline Phosphatase: 122 IU/L — ABNORMAL HIGH (ref 44–121)
BUN/Creatinine Ratio: 22 (ref 12–28)
BUN: 14 mg/dL (ref 8–27)
Bilirubin Total: 0.2 mg/dL (ref 0.0–1.2)
CO2: 23 mmol/L (ref 20–29)
Calcium: 10 mg/dL (ref 8.7–10.3)
Chloride: 102 mmol/L (ref 96–106)
Creatinine, Ser: 0.65 mg/dL (ref 0.57–1.00)
Globulin, Total: 1.9 g/dL (ref 1.5–4.5)
Glucose: 89 mg/dL (ref 70–99)
Potassium: 4.3 mmol/L (ref 3.5–5.2)
Sodium: 141 mmol/L (ref 134–144)
Total Protein: 6.5 g/dL (ref 6.0–8.5)
eGFR: 99 mL/min/{1.73_m2} (ref >59)

## 2024-03-30 LAB — VITAMIN D 25 HYDROXY (VIT D DEFICIENCY, FRACTURES): Vit D, 25-Hydroxy: 28.3 ng/mL — ABNORMAL LOW (ref 30.0–100.0)

## 2024-03-30 NOTE — Progress Notes (Signed)
 Dear Crystal [name], Your Vitamin D is  low. You need a prescription strength supplement I will send that in for you. The LDL cholesterol, the bad kind, was also elevated.  I recommend medication to bring this back down.  If you are willing to take that medication it does reduce your risk of heart attack and stroke.  I will send it to the your pharmacy at your request. Nurse, if at all possible, could you send in a prescription for the patient for vitamin D 50,000 units, 1 p.o. weekly #13 with 3 refills? Many thanks, WS

## 2024-03-31 MED ORDER — ROSUVASTATIN CALCIUM 10 MG PO TABS: 10.0000 mg | ORAL_TABLET | Freq: Every day | ORAL | 1 refills | Status: DC

## 2024-03-31 MED ORDER — VITAMIN D (ERGOCALCIFEROL) 1.25 MG (50000 UNIT) PO CAPS
50000.0000 [IU] | ORAL_CAPSULE | ORAL | 3 refills | Status: AC
Start: 2024-03-31 — End: ?

## 2024-03-31 NOTE — Addendum Note (Signed)
 Addended by: Roise Cleaver on: 03/31/2024 04:40 PM   Modules accepted: Orders

## 2024-04-06 ENCOUNTER — Other Ambulatory Visit: Payer: Self-pay

## 2024-04-06 DIAGNOSIS — Z1231 Encounter for screening mammogram for malignant neoplasm of breast: Secondary | ICD-10-CM

## 2024-04-09 ENCOUNTER — Ambulatory Visit

## 2024-04-09 DIAGNOSIS — Z23 Encounter for immunization: Secondary | ICD-10-CM | POA: Diagnosis not present

## 2024-04-09 NOTE — Progress Notes (Signed)
 Patient is in office today for a nurse visit for Shingrix injection. Patient Injection was given in the  Right deltoid. Patient tolerated injection well.

## 2024-05-03 ENCOUNTER — Ambulatory Visit
Admission: RE | Admit: 2024-05-03 | Discharge: 2024-05-03 | Disposition: A | Discharge status: Home / Self Care | Source: Ambulatory Visit | Attending: Family Medicine | Admitting: Family Medicine

## 2024-05-03 DIAGNOSIS — Z1231 Encounter for screening mammogram for malignant neoplasm of breast: Secondary | ICD-10-CM

## 2024-06-04 ENCOUNTER — Ambulatory Visit (INDEPENDENT_AMBULATORY_CARE_PROVIDER_SITE_OTHER): Admitting: *Deleted

## 2024-06-04 DIAGNOSIS — Z23 Encounter for immunization: Secondary | ICD-10-CM

## 2024-06-04 NOTE — Progress Notes (Signed)
 Patient is in office today for a nurse visit for Immunization. Patient Injection was given in the  Right deltoid. Patient tolerated injection well.

## 2024-08-16 ENCOUNTER — Other Ambulatory Visit: Payer: Self-pay | Admitting: Family Medicine

## 2024-10-04 ENCOUNTER — Ambulatory Visit: Payer: Self-pay | Admitting: Family Medicine

## 2024-10-04 ENCOUNTER — Encounter: Payer: Self-pay | Admitting: Family Medicine

## 2024-10-04 ENCOUNTER — Other Ambulatory Visit (HOSPITAL_COMMUNITY)
Admission: RE | Admit: 2024-10-04 | Discharge: 2024-10-04 | Disposition: A | Discharge status: Home / Self Care | Source: Ambulatory Visit | Attending: Family Medicine | Admitting: Family Medicine

## 2024-10-04 VITALS — BP 95/52 | HR 57 | Temp 98.0°F | Ht 64.0 in | Wt 125.0 lb

## 2024-10-04 DIAGNOSIS — I10 Essential (primary) hypertension: Secondary | ICD-10-CM | POA: Diagnosis not present

## 2024-10-04 DIAGNOSIS — B0229 Other postherpetic nervous system involvement: Secondary | ICD-10-CM | POA: Diagnosis not present

## 2024-10-04 DIAGNOSIS — Z01419 Encounter for gynecological examination (general) (routine) without abnormal findings: Secondary | ICD-10-CM | POA: Diagnosis present

## 2024-10-04 DIAGNOSIS — Z0000 Encounter for general adult medical examination without abnormal findings: Secondary | ICD-10-CM

## 2024-10-04 DIAGNOSIS — Z01411 Encounter for gynecological examination (general) (routine) with abnormal findings: Secondary | ICD-10-CM | POA: Diagnosis not present

## 2024-10-04 DIAGNOSIS — R011 Cardiac murmur, unspecified: Secondary | ICD-10-CM

## 2024-10-04 DIAGNOSIS — E782 Mixed hyperlipidemia: Secondary | ICD-10-CM

## 2024-10-04 DIAGNOSIS — E559 Vitamin D deficiency, unspecified: Secondary | ICD-10-CM

## 2024-10-04 LAB — URINALYSIS
Bilirubin, UA: NEGATIVE
Glucose, UA: NEGATIVE
Leukocytes,UA: NEGATIVE
Nitrite, UA: NEGATIVE
Protein,UA: NEGATIVE
RBC, UA: NEGATIVE
Specific Gravity, UA: 1.02 (ref 1.005–1.030)
Urobilinogen, Ur: 1 mg/dL (ref 0.2–1.0)
pH, UA: 6 (ref 5.0–7.5)

## 2024-10-04 LAB — LIPID PANEL

## 2024-10-04 MED ORDER — DICLOFENAC SODIUM 75 MG PO TBEC: DELAYED_RELEASE_TABLET | ORAL | 5 refills | Status: AC

## 2024-10-04 MED ORDER — AMLODIPINE BESYLATE 10 MG PO TABS: 10.0000 mg | ORAL_TABLET | Freq: Every day | ORAL | 3 refills | Status: AC

## 2024-10-04 MED ORDER — ROSUVASTATIN CALCIUM 10 MG PO TABS: 10.0000 mg | ORAL_TABLET | Freq: Every day | ORAL | 1 refills | Status: AC

## 2024-10-04 MED ORDER — LISINOPRIL 10 MG PO TABS: 10.0000 mg | ORAL_TABLET | Freq: Every day | ORAL | 3 refills | Status: DC

## 2024-10-04 NOTE — Progress Notes (Signed)
 Subjective:  Patient ID: Crystal [name], female    DOB: 03/31/1961  Age: 63 y.o. MRN: [id number]  CC: Annual Exam (FYI/Audiologist going to give her a hearing aid./Recent left shoulder injection for arthritis.)   HPI  Discussed the use of AI scribe software for clinical note transcription with the patient, who gave verbal consent to proceed.  History of Present Illness Crystal [name] is a 63 year old female who presents for a complete physical examination.  She has a history of hypertension, arthritis, hyperlipidemia, and low vitamin D . Her blood pressure is well-controlled. She takes rosuvastatin  for cholesterol management, having had issues with it in the past. For arthritis, she uses diclofenac  as needed, which is helpful, and occasionally uses tizanidine  for neck and shoulder tension. She received a cortisone injection in her shoulder about a month ago for arthritis pain, which was beneficial. She receives these injections every few years.  She experiences significant hearing loss, confirmed by audiology tests, and has used a hearing aid for five or six years. Initially, she thought her hearing issues might be related to allergies, but after changing her allergy medication from Allegra  to two sprays, it was determined that her hearing loss was not related to allergies. She has difficulty hearing sirens, railroad crossing signals, and distinguishing sounds, which is concerning for her safety.  She has a known cataract causing some vision problems. No chest pain, heart palpitations, shortness of breath, cough, heartburn, or indigestion.  She has a history of diverticulitis and irritable bowel syndrome, managed by avoiding foods with seeds, peelings, and greasy foods. As long as she eats carefully, she does not experience any problems.  She experiences a muscle spasm under her right ribs when lifting her arms, such as when changing clothes. This spasm lasts about one to two minutes and has  been occurring for a while. She avoids certain movements to prevent it.  She has a history of gum disease and teeth grinding, resulting in the loss of four teeth. She has had periodontal treatment but acknowledges the need for further dental care.          03/29/2024    9:03 AM 09/30/2023   10:43 AM 09/09/2022    3:31 PM  Depression screen PHQ 2/9  Decreased Interest 0 0 0  Down, Depressed, Hopeless 0 0 0  PHQ - 2 Score 0 0 0  Altered sleeping 0    Tired, decreased energy 0    Change in appetite 0    Feeling bad or failure about yourself  0    Trouble concentrating 0    Moving slowly or fidgety/restless 0    Suicidal thoughts 0    PHQ-9 Score 0     Difficult doing work/chores Not difficult at all       Data saved with a previous flowsheet row definition    History Crystal [name] has a past medical history of IBS (irritable bowel syndrome) and Post herpetic neuralgia.   She has a past surgical history that includes Tonsillectomy and Eye muscle surgery.   Her family history includes Cancer (age of onset: 47) in her mother; Hypertension in her father.She reports that she quit smoking about 12 years ago. Her smoking use included cigarettes. She has never used smokeless tobacco. She reports current alcohol use. She reports that she does not use drugs.    ROS Review of Systems  Constitutional:  Negative for appetite change, chills, diaphoresis, fatigue, fever and unexpected weight change.  HENT:  Negative for congestion, ear pain, hearing loss, postnasal drip, rhinorrhea, sneezing, sore throat and trouble swallowing.   Eyes:  Negative for pain.  Respiratory:  Negative for cough, chest tightness and shortness of breath.   Cardiovascular:  Negative for chest pain and palpitations.  Gastrointestinal:  Negative for abdominal pain, constipation, diarrhea, nausea and vomiting.  Endocrine: Negative for cold intolerance, heat intolerance, polydipsia, polyphagia and polyuria.  Genitourinary:   Negative for dysuria, frequency and menstrual problem.  Musculoskeletal:  Positive for arthralgias (left shoulder, neck). Negative for joint swelling.  Skin:  Negative for rash.  Allergic/Immunologic: Negative for environmental allergies.  Neurological:  Negative for dizziness, weakness, numbness and headaches.  Psychiatric/Behavioral:  Negative for agitation and dysphoric mood.     Objective:  BP (!) 95/52   Pulse (!) 57   Temp 98 F (36.7 C)   Ht 5' 4 (1.626 m)   Wt 125 lb (56.7 kg)   SpO2 96%   BMI 21.46 kg/m   BP Readings from Last 3 Encounters:  10/04/24 (!) 95/52  03/29/24 (!) 99/50  09/30/23 115/63    Wt Readings from Last 3 Encounters:  10/04/24 125 lb (56.7 kg)  03/29/24 125 lb (56.7 kg)  09/30/23 131 lb 3.2 oz (59.5 kg)     Physical Exam Exam conducted with a chaperone present.  Constitutional:      General: She is not in acute distress.    Appearance: Normal appearance. She is well-developed.  HENT:     Head: Normocephalic and atraumatic.     Right Ear: External ear normal.     Left Ear: External ear normal.     Nose: Nose normal.  Eyes:     Conjunctiva/sclera: Conjunctivae normal.     Pupils: Pupils are equal, round, and reactive to light.  Neck:     Thyroid : No thyromegaly.  Cardiovascular:     Rate and Rhythm: Normal rate and regular rhythm.     Heart sounds: Murmur heard.  Pulmonary:     Effort: Pulmonary effort is normal. No respiratory distress.     Breath sounds: Normal breath sounds. No wheezing or rales.  Chest:  Breasts:    Breasts are symmetrical.     Right: No inverted nipple, mass or tenderness.     Left: No inverted nipple, mass or tenderness.  Abdominal:     General: Bowel sounds are normal. There is no distension or abdominal bruit.     Palpations: Abdomen is soft. There is no hepatomegaly, splenomegaly or mass.     Tenderness: There is no abdominal tenderness. Negative signs include Murphy's sign and McBurney's sign.      Hernia: There is no hernia in the left inguinal area or right inguinal area.  Genitourinary:    Exam position: Lithotomy position.     Pubic Area: No rash.      Tanner stage (genital): 5.     Labia:        Right: No tenderness.        Left: No tenderness.      Cervix: Normal. No cervical motion tenderness, discharge, friability, lesion, erythema or cervical bleeding.  Musculoskeletal:        General: No tenderness. Normal range of motion.     Cervical back: Normal range of motion and neck supple.  Lymphadenopathy:     Cervical: No cervical adenopathy.  Skin:    General: Skin is warm and dry.     Findings: No rash.  Neurological:  Mental Status: She is alert and oriented to person, place, and time.     Deep Tendon Reflexes: Reflexes are normal and symmetric.  Psychiatric:        Behavior: Behavior normal.        Thought Content: Thought content normal.        Judgment: Judgment normal.    Physical Exam GENERAL: Alert, cooperative, well developed, no acute distress HEENT: Normocephalic, normal oropharynx, moist mucous membranes, missing teeth in upper jaw CHEST: Clear to auscultation bilaterally, no wheezes, rhonchi, or crackles CARDIOVASCULAR: Normal heart rate and rhythm, S1 and S2 normal without murmurs ABDOMEN: Soft, non-tender, non-distended, without organomegaly, normal bowel sounds EXTREMITIES: No cyanosis or edema NEUROLOGICAL: Cranial nerves grossly intact, moves all extremities without gross motor or sensory deficit   Assessment & Plan:  Well adult exam -     CBC with Differential/Platelet -     CMP14+EGFR -     Urinalysis  PHN (postherpetic neuralgia) -     Diclofenac  Sodium; TAKE 1 TABLET 2 TIMES A DAY FOR MUSCLE AND JOINT PAIN.  Dispense: 60 tablet; Refill: 5  Primary hypertension -     amLODIPine  Besylate; Take 1 tablet (10 mg total) by mouth daily.  Dispense: 90 tablet; Refill: 3 -     CBC with Differential/Platelet -     CMP14+EGFR -      Urinalysis  Mixed hyperlipidemia -     CBC with Differential/Platelet -     CMP14+EGFR -     Lipid panel  Murmur, cardiac -     EKG 12-Lead  Vitamin D  deficiency -     CBC with Differential/Platelet -     CMP14+EGFR -     VITAMIN D  25 Hydroxy (Vit-D Deficiency, Fractures)  Well female exam with routine gynecological exam -     Cytology - PAP  Other orders -     Rosuvastatin  Calcium ; Take 1 tablet (10 mg total) by mouth daily. For cholesterol  Dispense: 90 tablet; Refill: 1    Assessment and Plan Assessment & Plan Adult Wellness Visit   Routine adult wellness visit with no acute concerns. Blood pressure is well-controlled. She reports no dizziness, syncope, headaches, earaches, sore throats, vision problems, chest pain, palpitations, dyspnea, cough, or heartburn. Cataract causes minor vision issues. No recent Pap smear in 12-15 years. Mammogram completed in June or July.  Essential hypertension   Blood pressure is well-controlled. No recent dizziness, syncope, or energy problems.  Mixed hyperlipidemia   Managed with rosuvastatin .  Vitamin D  deficiency   Managed with vitamin D  50,000 units orally weekly.  Hearing loss   Significant hearing loss confirmed by audiology. Hearing aid to be obtained. Hearing loss not related to allergies.  Cataract   Causes minor vision issues. No immediate intervention planned.  Osteoarthritis with shoulder and neck involvement   Recent cortisone injection in shoulder provided relief. Diclofenac  used as needed for pain. Tizanidine  used occasionally for muscle tension. No desire for surgical intervention.  Diverticulosis with history of diverticulitis and irritable bowel syndrome   Managed with dietary modifications to avoid seeds, peelings, and greasy foods.  Muscle spasm of right upper abdomen   Intermittent muscle spasm under right ribs when lifting arms, lasting 1-2 minutes. No acute intervention planned.  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 Follow-up with periodontist recommended.       Follow-up: Return in about 6 months (around 04/03/2025).  Crystal [name], M.D.

## 2024-10-05 ENCOUNTER — Ambulatory Visit: Payer: Self-pay | Admitting: Family Medicine

## 2024-10-05 LAB — CMP14+EGFR
ALT: 14 IU/L (ref 0–32)
AST: 20 IU/L (ref 0–40)
Albumin: 4.4 g/dL (ref 3.9–4.9)
Alkaline Phosphatase: 94 IU/L (ref 49–135)
BUN/Creatinine Ratio: 28 (ref 12–28)
BUN: 23 mg/dL (ref 8–27)
Bilirubin Total: 0.2 mg/dL (ref 0.0–1.2)
CO2: 27 mmol/L (ref 20–29)
Calcium: 9.9 mg/dL (ref 8.7–10.3)
Chloride: 103 mmol/L (ref 96–106)
Creatinine, Ser: 0.81 mg/dL (ref 0.57–1.00)
Globulin, Total: 2 g/dL (ref 1.5–4.5)
Glucose: 85 mg/dL (ref 70–99)
Potassium: 4.7 mmol/L (ref 3.5–5.2)
Sodium: 142 mmol/L (ref 134–144)
Total Protein: 6.4 g/dL (ref 6.0–8.5)
eGFR: 82 mL/min/1.73 (ref >59)

## 2024-10-05 LAB — CBC WITH DIFFERENTIAL/PLATELET
Basophils Absolute: 0.1 x10E3/uL (ref 0.0–0.2)
Basos: 1 %
EOS (ABSOLUTE): 0.3 x10E3/uL (ref 0.0–0.4)
Eos: 3 %
Hematocrit: 36.8 % (ref 34.0–46.6)
Hemoglobin: 12.4 g/dL (ref 11.1–15.9)
Immature Grans (Abs): 0 x10E3/uL (ref 0.0–0.1)
Immature Granulocytes: 0 %
Lymphocytes Absolute: 3.4 x10E3/uL — ABNORMAL HIGH (ref 0.7–3.1)
Lymphs: 39 %
MCH: 29.7 pg (ref 26.6–33.0)
MCHC: 33.7 g/dL (ref 31.5–35.7)
MCV: 88 fL (ref 79–97)
Monocytes Absolute: 0.5 x10E3/uL (ref 0.1–0.9)
Monocytes: 6 %
Neutrophils Absolute: 4.4 x10E3/uL (ref 1.4–7.0)
Neutrophils: 51 %
Platelets: 259 x10E3/uL (ref 150–450)
RBC: 4.18 x10E6/uL (ref 3.77–5.28)
RDW: 13 % (ref 11.7–15.4)
WBC: 8.6 x10E3/uL (ref 3.4–10.8)

## 2024-10-05 LAB — LIPID PANEL
Cholesterol, Total: 139 mg/dL (ref 100–199)
HDL: 55 mg/dL (ref >39)
LDL CALC COMMENT:: 2.5 ratio (ref 0.0–4.4)
LDL Chol Calc (NIH): 67 mg/dL (ref 0–99)
Triglycerides: 89 mg/dL (ref 0–149)
VLDL Cholesterol Cal: 17 mg/dL (ref 5–40)

## 2024-10-05 LAB — VITAMIN D 25 HYDROXY (VIT D DEFICIENCY, FRACTURES): Vit D, 25-Hydroxy: 91 ng/mL (ref 30.0–100.0)

## 2024-10-05 NOTE — Progress Notes (Signed)
Hello Maridel,  Your lab result is normal and/or stable.Some minor variations that are not significant are commonly marked abnormal, but do not represent any medical problem for you.  Best regards, Kaci Dillie, M.D.

## 2024-10-06 LAB — CYTOLOGY - PAP: Diagnosis: NEGATIVE

## 2024-10-26 ENCOUNTER — Telehealth: Payer: Self-pay | Admitting: Family Medicine

## 2024-10-26 DIAGNOSIS — Z0279 Encounter for issue of other medical certificate: Secondary | ICD-10-CM

## 2024-10-26 NOTE — Telephone Encounter (Signed)
 PT faxed FMLA forms to be completed  Form Fee Paid? NO            If NO, form is placed on front office manager desk to hold until payment received. If YES, then form will be placed in the RX/HH Nurse Coordinators box for completion.  Form will not be processed until payment is received  LMTCB to collect payment and need more information about reason for FMLA

## 2024-10-26 NOTE — Telephone Encounter (Signed)
 Rosaura Bolon dropped off FMLA forms to be completed and signed.  Form Fee Paid? (Y/N)     yes       If NO, form is placed on front office manager desk to hold until payment received. If YES, then form will be placed in the [facility] Nurse Coordinators box for completion.  Form will not be processed until payment is received   Please fax to [facility] & send pt an email copy to her.

## 2024-10-28 NOTE — Telephone Encounter (Signed)
 Patient aware form completed and faxed and copy up front to pick up and scanned in and in cathy drawer.

## 2025-04-04 ENCOUNTER — Ambulatory Visit: Admitting: Family Medicine
